# Patient Record
Sex: Female | Born: 1985 | Race: White | Hispanic: No | Marital: Single | Smoking: Never smoker
Health system: Southern US, Community
[De-identification: ages and names within clinical notes are randomized; demographics above are authoritative.]

## PROBLEM LIST (undated history)

## (undated) DIAGNOSIS — G43909 Migraine, unspecified, not intractable, without status migrainosus: Secondary | ICD-10-CM

## (undated) DIAGNOSIS — S0300XA Dislocation of jaw, unspecified side, initial encounter: Secondary | ICD-10-CM

## (undated) DIAGNOSIS — T7840XA Allergy, unspecified, initial encounter: Secondary | ICD-10-CM

## (undated) HISTORY — PX: TUMOR REMOVAL: SHX12

## (undated) HISTORY — PX: OTHER SURGICAL HISTORY: SHX169

## (undated) HISTORY — DX: Migraine, unspecified, not intractable, without status migrainosus: G43.909

---

## 2013-03-07 ENCOUNTER — Encounter: Payer: Self-pay | Admitting: Obstetrics & Gynecology

## 2013-03-07 ENCOUNTER — Ambulatory Visit (INDEPENDENT_AMBULATORY_CARE_PROVIDER_SITE_OTHER): Payer: BC Managed Care – PPO | Admitting: Obstetrics & Gynecology

## 2013-03-07 VITALS — BP 108/64 | HR 66 | Ht 71.0 in | Wt 192.0 lb

## 2013-03-07 DIAGNOSIS — Z01419 Encounter for gynecological examination (general) (routine) without abnormal findings: Secondary | ICD-10-CM | POA: Insufficient documentation

## 2013-03-07 DIAGNOSIS — Z124 Encounter for screening for malignant neoplasm of cervix: Secondary | ICD-10-CM

## 2013-03-07 MED ORDER — LEVONORGESTREL-ETHINYL ESTRAD 0.15-30 MG-MCG PO TABS: 1.0000 | ORAL_TABLET | Freq: Every day | ORAL | Status: DC

## 2013-03-07 NOTE — Progress Notes (Signed)
  Subjective:     Lynn Sosa is a 27 y.o. female here for a routine exam.  Current complaints: none.  Personal health questionnaire reviewed: yes.   Gynecologic History Patient's last menstrual period was 02/20/2013. Contraception: OCP (estrogen/progesterone) Last Pap: 2011. Results were: normal Last mammogram: n/a. Results were: normal  Obstetric History OB History   Grav Para Term Preterm Abortions TAB SAB Ect Mult Living                   The following portions of the patient's history were reviewed and updated as appropriate: allergies, current medications, past family history, past medical history, past social history, past surgical history and problem list.  Review of Systems A comprehensive review of systems was negative.    Objective:   Filed Vitals:   03/07/13 0902  BP: 108/64  Pulse: 66  Height: 5\' 11"  (1.803 m)  Weight: 192 lb (87.091 kg)      Vitals:  WNL General appearance: alert, cooperative and no distress Head: Normocephalic, without obvious abnormality, atraumatic Eyes: negative Throat: lips, mucosa, and tongue normal; teeth and gums normal Lungs: clear to auscultation bilaterally Breasts: normal appearance, no masses or tenderness, No nipple retraction or dimpling, No nipple discharge or bleeding Heart: regular rate and rhythm Abdomen: soft, non-tender; bowel sounds normal; no masses,  no organomegaly Pelvic: cervix normal in appearance, external genitalia normal, no adnexal masses or tenderness, no bladder tenderness, no cervical motion tenderness, perianal skin: no external genital warts noted, urethra without abnormality or discharge, uterus normal size, shape, and consistency and vagina normal without discharge Extremities: no edema, redness or tenderness in the calves or thighs Skin: no lesions or rash Lymph nodes: Axillary adenopathy: none        Assessment:    Healthy female exam.    Plan:    Education reviewed: self breast exams and  skin cancer screening. Contraception: OCP (estrogen/progesterone). Follow up in: 1 years.

## 2014-02-21 ENCOUNTER — Other Ambulatory Visit: Payer: Self-pay | Admitting: Obstetrics & Gynecology

## 2014-03-12 ENCOUNTER — Ambulatory Visit: Payer: BC Managed Care – PPO | Admitting: Obstetrics & Gynecology

## 2014-03-18 ENCOUNTER — Ambulatory Visit (INDEPENDENT_AMBULATORY_CARE_PROVIDER_SITE_OTHER): Payer: PRIVATE HEALTH INSURANCE | Admitting: Advanced Practice Midwife

## 2014-03-18 ENCOUNTER — Encounter: Payer: Self-pay | Admitting: Advanced Practice Midwife

## 2014-03-18 VITALS — BP 119/74 | HR 71 | Resp 16 | Ht 71.0 in | Wt 184.0 lb

## 2014-03-18 DIAGNOSIS — Z113 Encounter for screening for infections with a predominantly sexual mode of transmission: Secondary | ICD-10-CM

## 2014-03-18 DIAGNOSIS — Z1151 Encounter for screening for human papillomavirus (HPV): Secondary | ICD-10-CM

## 2014-03-18 DIAGNOSIS — Z124 Encounter for screening for malignant neoplasm of cervix: Secondary | ICD-10-CM

## 2014-03-18 DIAGNOSIS — N921 Excessive and frequent menstruation with irregular cycle: Secondary | ICD-10-CM

## 2014-03-18 DIAGNOSIS — Z01419 Encounter for gynecological examination (general) (routine) without abnormal findings: Secondary | ICD-10-CM

## 2014-03-18 MED ORDER — LEVONORGESTREL-ETHINYL ESTRAD 0.15-30 MG-MCG PO TABS: ORAL_TABLET | ORAL | Status: DC

## 2014-03-18 NOTE — Progress Notes (Signed)
Subjective:     Lynn Sosa is a 28 y.o. female here for a routine exam.  Current complaints: spotting in between periods (she also reports nosebleed recently).     Gynecologic History Patient's last menstrual period was 02/18/2014. Contraception: OCP (estrogen/progesterone) Last Pap: 2013. Results were: normal Last mammogram: n/a related to age  Obstetric History OB History  Gravida Para Term Preterm AB SAB TAB Ectopic Multiple Living  0 0 0 0 0 0 0 0 0 0          The following portions of the patient's history were reviewed and updated as appropriate: allergies, current medications, past family history, past medical history, past social history, past surgical history and problem list.  Review of Systems A comprehensive review of systems was negative.    Objective:    BP 119/74  Pulse 71  Resp 16  Ht 5\' 11"  (1.803 m)  Wt 83.462 kg (184 lb)  BMI 25.67 kg/m2  LMP 02/18/2014  General Appearance:    Alert, cooperative, no distress, appears stated age  Head:    Normocephalic, without obvious abnormality, atraumatic  Eyes:    PERRL, conjunctiva/corneas clear, EOM's intact, fundi    benign, both eyes  Ears:    Normal TM's and external ear canals, both ears  Nose:   Nares normal, septum midline, mucosa normal, no drainage    or sinus tenderness  Throat:   Lips, mucosa, and tongue normal; teeth and gums normal  Neck:   Supple, symmetrical, trachea midline, no adenopathy;    thyroid:  no enlargement/tenderness/nodules; no carotid   bruit or JVD  Back:     Symmetric, no curvature, ROM normal, no CVA tenderness  Lungs:     Clear to auscultation bilaterally, respirations unlabored  Chest Wall:    No tenderness or deformity   Heart:    Regular rate and rhythm, S1 and S2 normal, no murmur, rub   or gallop  Breast Exam:    No tenderness, masses, or nipple abnormality  Abdomen:     Soft, non-tender, bowel sounds active all four quadrants,    no masses, no organomegaly  Genitalia:     Cervix pink, visually closed, without lesion but significantly friable, scant white creamy discharge, vaginal walls and external genitalia normal Bimanual exam: Cervix 0/long/high, firm, anterior, neg CMT, uterus nontender, nonenlarged, adnexa without tenderness, enlargement, or mass  Rectal:    Normal tone, normal prostate, no masses or tenderness;   guaiac negative stool  Extremities:   Extremities normal, atraumatic, no cyanosis or edema  Pulses:   2+ and symmetric all extremities  Skin:   Skin color, texture, turgor normal, no rashes or lesions  Lymph nodes:   Cervical, supraclavicular, and axillary nodes normal  Neurologic:   CNII-XII intact, normal strength, sensation and reflexes    throughout      Assessment:    Healthy female exam.    Plan:    Education reviewed: safe sex/STD prevention, self breast exams and weight bearing exercise. Return in 1 year for annual well-woman exam.  Pap schedule based on results today.    Addendum:  Discussed pt friable cervix with Dr Marice Potterove.  Likely ectropion r/t OCPs.  Pt may stop OCPs x1-2 months if desired then resume.  Called pt to discuss and reassure her of likely normal finding.  Pap results pending. Pt states understanding.

## 2014-03-18 NOTE — Patient Instructions (Signed)
   No Primary Care Doctor:  To locate a primary care doctor that accepts your insurance or provides certain services:           [place] [facility]: [phone]           Physician Referral Service: [phone] ask for "My [place]"   If no insurance, you need to see if you qualify for [facility] "[facility]", call to set      up appointment for eligibility/enrollment at [phone] or [phone] or visit [facility] and [employer] ([address], [place] and [address]) to meet with a [facility] enrollment specialist.

## 2014-03-20 DIAGNOSIS — N921 Excessive and frequent menstruation with irregular cycle: Secondary | ICD-10-CM | POA: Insufficient documentation

## 2014-03-20 LAB — CERVICOVAGINAL ANCILLARY ONLY
BACTERIAL VAGINITIS: NEGATIVE
Candida vaginitis: NEGATIVE

## 2015-02-20 ENCOUNTER — Other Ambulatory Visit: Payer: Self-pay | Admitting: *Deleted

## 2015-02-20 DIAGNOSIS — Z01419 Encounter for gynecological examination (general) (routine) without abnormal findings: Secondary | ICD-10-CM

## 2015-02-20 MED ORDER — LEVONORGESTREL-ETHINYL ESTRAD 0.15-30 MG-MCG PO TABS: ORAL_TABLET | ORAL | Status: DC

## 2015-02-20 NOTE — Telephone Encounter (Signed)
RF request from [facility] [address] authorized x 1.  Will need annual appoint.

## 2015-03-19 ENCOUNTER — Other Ambulatory Visit: Payer: Self-pay | Admitting: Obstetrics & Gynecology

## 2015-03-21 ENCOUNTER — Ambulatory Visit (INDEPENDENT_AMBULATORY_CARE_PROVIDER_SITE_OTHER): Payer: PRIVATE HEALTH INSURANCE | Admitting: Family

## 2015-03-21 ENCOUNTER — Encounter: Payer: Self-pay | Admitting: Family

## 2015-03-21 VITALS — BP 125/72 | HR 84 | Resp 16 | Ht 71.0 in | Wt 180.0 lb

## 2015-03-21 DIAGNOSIS — Z3041 Encounter for surveillance of contraceptive pills: Secondary | ICD-10-CM

## 2015-03-21 DIAGNOSIS — Z01419 Encounter for gynecological examination (general) (routine) without abnormal findings: Secondary | ICD-10-CM | POA: Diagnosis not present

## 2015-03-21 DIAGNOSIS — R102 Pelvic and perineal pain: Secondary | ICD-10-CM | POA: Diagnosis not present

## 2015-03-21 MED ORDER — LEVONORGESTREL-ETHINYL ESTRAD 0.15-30 MG-MCG PO TABS: ORAL_TABLET | ORAL | Status: DC

## 2015-03-21 NOTE — Progress Notes (Signed)
  Subjective:     Lynn Sosa is a 29 y.o. female here for a routine exam.  Current complaints: felt left sided pain x 2 a month ago.  History of ovarian cyst. Partner x 3 years.  Declines STD screen.     Gynecologic History Patient's last menstrual period was 03/17/2015. Contraception: OCP (estrogen/progesterone) Last Pap: March 2015. Results were: normal; HR HPV negative Last mammogram: n/a.   Obstetric History OB History  Gravida Para Term Preterm AB SAB TAB Ectopic Multiple Living  0 0 0 0 0 0 0 0 0 0          The following portions of the patient's history were reviewed and updated as appropriate: allergies, current medications, past family history, past medical history, past social history, past surgical history and problem list.  Review of Systems Pertinent items are noted in HPI.    Objective:   BP 125/72 mmHg  Pulse 84  Resp 16  Ht 5\' 11"  (1.803 m)  Wt 180 lb (81.647 kg)  BMI 25.12 kg/m2  LMP 03/17/2015 General appearance: alert, cooperative and appears stated age Head: Normocephalic, without obvious abnormality, atraumatic Neck: no adenopathy, no carotid bruit, no JVD, supple, symmetrical, trachea midline and thyroid not enlarged, symmetric, no tenderness/mass/nodules Lungs: clear to auscultation bilaterally Breasts: normal appearance, no masses or tenderness, No nipple retraction or dimpling, No nipple discharge or bleeding, No axillary or supraclavicular adenopathy, Normal to palpation without dominant masses, Taught monthly breast self examination Heart: regular rate and rhythm, S1, S2 normal, no murmur, click, rub or gallop Abdomen: soft, non-tender; bowel sounds normal; no masses,  no organomegaly Pelvic: cervix normal in appearance, external genitalia normal, no adnexal masses or tenderness, no cervical motion tenderness, rectovaginal septum normal, uterus normal size, shape, and consistency and vagina normal without discharge Skin: Skin color, texture, turgor  normal. No rashes or lesions     Assessment:   Intermittent Left-sided pelvic pain  Healthy female exam.    Plan:    Contraception: oral progesterone-only contraceptive.    Report if pain returns will do pelvic ultrasound to assess for cysts.   Return in one year for well woman exam - will do pap at that visit.  Not needed today - negative HR HPV  Marlis EdelsonWalidah N Karim, CNM

## 2016-03-17 ENCOUNTER — Other Ambulatory Visit: Payer: Self-pay | Admitting: Family

## 2017-02-11 ENCOUNTER — Other Ambulatory Visit: Payer: Self-pay | Admitting: Family

## 2017-05-30 ENCOUNTER — Encounter: Payer: Self-pay | Admitting: Obstetrics & Gynecology

## 2017-05-30 ENCOUNTER — Ambulatory Visit (INDEPENDENT_AMBULATORY_CARE_PROVIDER_SITE_OTHER): Payer: PRIVATE HEALTH INSURANCE | Admitting: Obstetrics & Gynecology

## 2017-05-30 VITALS — BP 108/71 | HR 68 | Ht 71.0 in | Wt 218.0 lb

## 2017-05-30 DIAGNOSIS — Z01419 Encounter for gynecological examination (general) (routine) without abnormal findings: Secondary | ICD-10-CM | POA: Diagnosis not present

## 2017-05-30 DIAGNOSIS — Z113 Encounter for screening for infections with a predominantly sexual mode of transmission: Secondary | ICD-10-CM | POA: Diagnosis not present

## 2017-05-30 MED ORDER — NORETHIN ACE-ETH ESTRAD-FE 1-20 MG-MCG(24) PO TABS: 1.0000 | ORAL_TABLET | Freq: Every day | ORAL | 11 refills | Status: DC

## 2017-05-30 NOTE — Progress Notes (Signed)
Subjective:     Lynn Sosa is a 31 y.o. female here for a routine exam.  Current complaints: darkening of skin over upper lip and occasional spotting week 2 or 3 of birth control pills.  She has been on same brand for 10 years.  Recently broke up with boyfriend and he is causing stress (mild stalking).  Pt has had a few new sexual partners and did not use condoms every time.   Gynecologic History Patient's last menstrual period was 05/10/2017. Contraception: OCP (estrogen/progesterone) Last Pap: due today.   Obstetric History OB History  Gravida Para Term Preterm AB Living  0 0 0 0 0 0  SAB TAB Ectopic Multiple Live Births  0 0 0 0           The following portions of the patient's history were reviewed and updated as appropriate: allergies, current medications, past family history, past medical history, past social history, past surgical history and problem list.  Review of Systems Pertinent items noted in HPI and remainder of comprehensive ROS otherwise negative.    Objective:      Vitals:   05/30/17 1506  BP: 108/71  Pulse: 68  Weight: 218 lb (98.9 kg)  Height: 5\' 11"  (1.803 m)   Vitals:  WNL General appearance: alert, cooperative and no distress  HEENT: Normocephalic, without obvious abnormality, atraumatic Eyes: negative Throat: lips, mucosa, and tongue normal; teeth and gums normal  Respiratory: Clear to auscultation bilaterally  CV: Regular rate and rhythm  Breasts:  Normal appearance, no masses or tenderness, no nipple retraction or dimpling  GI: Soft, non-tender; bowel sounds normal; no masses,  no organomegaly  GU: External Genitalia:  Tanner V, no lesion Urethra:  No prolapse   Vagina: Pink, normal rugae, no blood or discharge  Cervix: No CMT, no lesion  Uterus:  Normal size and contour, non tender  Adnexa: Normal, no masses, non tender  Musculoskeletal: No edema, redness or tenderness in the calves or thighs  Skin: Slight darkening of skin above upper  lip, no hair growth  Lymphatic: Axillary adenopathy: none     Psychiatric: Normal mood and behavior     Assessment:    Healthy female exam.      Plan:   STD check (cultures and blood work) Pap with cotesting Grandmother and 3 great aunts with breast cancer; will discuss with her mother about genetic testing. Change OCPs to lo estrin 24 Derm referral for skin changes.

## 2017-05-31 LAB — SYPHILIS: RPR W/REFLEX TO RPR TITER AND TREPONEMAL ANTIBODIES, TRADITIONAL SCREENING AND DIAGNOSIS ALGORITHM

## 2017-05-31 LAB — HIV ANTIBODY (ROUTINE TESTING W REFLEX): HIV 1&2 Ab, 4th Generation: NONREACTIVE

## 2017-05-31 LAB — HEPATITIS B SURFACE ANTIGEN: Hepatitis B Surface Ag: NEGATIVE

## 2017-05-31 LAB — HEPATITIS C ANTIBODY: HCV AB: NEGATIVE

## 2017-06-02 ENCOUNTER — Telehealth: Payer: Self-pay | Admitting: *Deleted

## 2017-06-02 NOTE — Telephone Encounter (Signed)
Pt left message on nurse voice mail requesting test results.

## 2017-06-02 NOTE — Telephone Encounter (Signed)
Pt left message on voicemail requesting her test results from 05/30/17.  I called patient and left message on her voicemail that all of her labwork was normal but that the pap and associated labs were still pending.

## 2017-06-03 LAB — CYTOLOGY - PAP
CHLAMYDIA, DNA PROBE: NEGATIVE
Diagnosis: NEGATIVE
HPV (WINDOPATH): DETECTED — AB
NEISSERIA GONORRHEA: NEGATIVE

## 2017-06-07 ENCOUNTER — Encounter: Payer: Self-pay | Admitting: Obstetrics & Gynecology

## 2017-06-07 ENCOUNTER — Encounter: Payer: Self-pay | Admitting: *Deleted

## 2017-06-07 ENCOUNTER — Telehealth: Payer: Self-pay | Admitting: *Deleted

## 2017-06-07 DIAGNOSIS — R87810 Cervical high risk human papillomavirus (HPV) DNA test positive: Secondary | ICD-10-CM | POA: Insufficient documentation

## 2017-06-07 NOTE — Telephone Encounter (Signed)
Letter mailed to patient stating that her pap was normal but positive for HPV and recommend repeat pap in 1 year per Dr Penne LashLeggett.

## 2017-06-07 NOTE — Telephone Encounter (Signed)
-----   Message from Lesly DukesKelly H Leggett, MD sent at 06/07/2017  5:13 AM EDT ----- HPV positive with Nml cytology.  Rpt co-testing in 1 year.  Notify patient.

## 2017-06-08 ENCOUNTER — Telehealth: Payer: Self-pay | Admitting: Obstetrics & Gynecology

## 2017-06-08 NOTE — Telephone Encounter (Signed)
Pt states that when she came to her appt, she was told that there would be some changes to her birth control pills and she wants to know what changes were made and what side effects she can expect. Pt also wants to know if there is a cheaper option. Please advise.

## 2017-06-08 NOTE — Telephone Encounter (Signed)
Spoke with pt about the changes in her BC pills. She was also notified about her Neg pap but positive HPV.  She will repeat the pap in 1 year.

## 2017-09-11 ENCOUNTER — Encounter (HOSPITAL_COMMUNITY): Payer: Self-pay | Admitting: *Deleted

## 2017-09-11 ENCOUNTER — Ambulatory Visit (HOSPITAL_COMMUNITY)
Admission: EM | Admit: 2017-09-11 | Discharge: 2017-09-11 | Disposition: A | Discharge status: Home / Self Care | Payer: PRIVATE HEALTH INSURANCE | Attending: Family Medicine | Admitting: Family Medicine

## 2017-09-11 DIAGNOSIS — S0990XA Unspecified injury of head, initial encounter: Secondary | ICD-10-CM

## 2017-09-11 HISTORY — DX: Dislocation of jaw, unspecified side, initial encounter: S03.00XA

## 2017-09-11 NOTE — ED Provider Notes (Signed)
[facility]   [id number] 09/11/17 Arrival Time: 1650   SUBJECTIVE:  Lynn Sosa is a 31 y.o. female who presents to the urgent care with complaint of head injury 6 hours ago when moving a desk and getting up striking the left parietal area of scalp.  No vomiting or visual disturbance.     Past Medical History:  Diagnosis Date  . Migraines   . TMJ (dislocation of temporomandibular joint)    Family History  Problem Relation Age of Onset  . Arthritis Mother   . Hypertension Mother   . Liver disease Father   . Cancer Maternal Aunt        breast CA  . Cancer Maternal Grandmother        breast CA  . Hypertension Maternal Grandmother   . Diabetes Paternal Grandmother   . Cancer Paternal Grandfather        liver   Social History   Social History  . Marital status: Single    Spouse name: N/A  . Number of children: N/A  . Years of education: N/A   Occupational History  . Not on file.   Social History Main Topics  . Smoking status: Never Smoker  . Smokeless tobacco: Never Used  . Alcohol use Yes     Comment: Socially  . Drug use: No  . Sexual activity: Not on file   Other Topics Concern  . Not on file   Social History Narrative  . No narrative on file   Current Meds  Medication Sig  . fluticasone (FLONASE) 50 MCG/ACT nasal spray 1 spray by Each Nare route daily.  Marland Kitchen loratadine (CLARITIN) 10 MG tablet Take 10 mg by mouth.   No Known Allergies    ROS: As per HPI, remainder of ROS negative.   OBJECTIVE:   Vitals:   09/11/17 1722  BP: (!) 115/59  Pulse: 69  Resp: 16  Temp: 98.2 F (36.8 C)  TempSrc: Oral  SpO2: 99%     General appearance: alert; no distress Eyes: PERRL; EOMI; conjunctiva normal HENT: normocephalic; atraumatic; TMs normal, canal normal, external ears normal without trauma; nasal mucosa normal; oral mucosa normal; fundi normal Neck: supple Back: no CVA tenderness Extremities: no cyanosis or edema; symmetrical with no  gross deformities Skin: warm and dry. 2 cm left parietal lac, dry and superficial Neurologic: normal gait; grossly normal Psychological: alert and cooperative; normal mood and affect    Labs:  Results for orders placed or performed in visit on 05/30/17  RPR  Result Value Ref Range   RPR Ser Ql NON REAC NON REAC  HIV antibody (with reflex)  Result Value Ref Range   HIV 1&2 Ab, 4th Generation NONREACTIVE NONREACTIVE  Hepatitis B Surface AntiGEN  Result Value Ref Range   Hepatitis B Surface Ag NEGATIVE NEGATIVE  Hepatitis C Antibody  Result Value Ref Range   HCV Ab NEGATIVE NEGATIVE  Cytology - PAP  Result Value Ref Range   Adequacy      Satisfactory for evaluation  endocervical/transformation zone component PRESENT.   Diagnosis      NEGATIVE FOR INTRAEPITHELIAL LESIONS OR MALIGNANCY. BENIGN REACTIVE/REPARATIVE CHANGES.   Chlamydia Negative    Neisseria gonorrhea Negative    HPV DETECTED (A)    Material Submitted CervicoVaginal Pap [ThinPrep Imaged]    CYTOLOGY - PAP PAP RESULT     Labs Reviewed - No data to display  No results found.     ASSESSMENT & PLAN:  1. Minor head  injury, initial encounter     No orders of the defined types were placed in this encounter.   Reviewed expectations re: course of current medical issues. Questions answered. Outlined signs and symptoms indicating need for more acute intervention. Patient verbalized understanding. After Visit Summary given.       Elvina Sidle, MD 09/11/17 1746

## 2017-09-11 NOTE — ED Triage Notes (Signed)
Reports moving a rug underneath a desk at approx 1130 today when she came up, hitting head on desk.  Denies any LOC.  C/O laceration to scalp - small superficial laceration noted.  C/O nausea and significant HA despite IBU.

## 2018-11-06 ENCOUNTER — Ambulatory Visit (INDEPENDENT_AMBULATORY_CARE_PROVIDER_SITE_OTHER): Payer: BLUE CROSS/BLUE SHIELD

## 2018-11-06 ENCOUNTER — Ambulatory Visit (HOSPITAL_COMMUNITY)
Admission: EM | Admit: 2018-11-06 | Discharge: 2018-11-06 | Disposition: A | Discharge status: Home / Self Care | Payer: BLUE CROSS/BLUE SHIELD | Attending: Emergency Medicine | Admitting: Emergency Medicine

## 2018-11-06 ENCOUNTER — Encounter (HOSPITAL_COMMUNITY): Payer: Self-pay | Admitting: Emergency Medicine

## 2018-11-06 DIAGNOSIS — M79671 Pain in right foot: Secondary | ICD-10-CM

## 2018-11-06 MED ORDER — MELOXICAM 15 MG PO TABS: 15.0000 mg | ORAL_TABLET | Freq: Every day | ORAL | 0 refills | Status: AC

## 2018-11-06 NOTE — ED Provider Notes (Signed)
[facility]    CSN: [id number] Arrival date & time: 11/06/18  1804     History   Chief Complaint Chief Complaint  Patient presents with  . Foot Pain    HPI Lynn Sosa is a 32 y.o. female.   Lynn Sosa presents with complaints of right lateral foot pain which started two days ago. She is training for a half marathon in 5 days, was doing a longer run at onset of pain. At mile 8 developed pain and had to walk. Pain has persisted since, states she has been on her feet a lot for work since as well. Yesterday wore heels which did not seem to exacerbate the pain. Today had pain which waxed and waned, worsens with weight bearing. 4/10 currently but when it flares it increases to 7/10. Denies any previous similar. States has had history of pain to arches but this feels different. No numbness or tingling. No other specific injury to the foot. Has taken ibuprofen and tylenol which haven't helped. Mild swelling this morning, no redness. Hx of tmj and migraines.     ROS per HPI.      Past Medical History:  Diagnosis Date  . Migraines   . TMJ (dislocation of temporomandibular joint)     Patient Active Problem List   Diagnosis Date Noted  . Cervical high risk HPV (human papillomavirus) test positive 06/07/2017  . Breakthrough bleeding on OCPs 03/20/2014  . Routine gynecological examination 03/07/2013    Past Surgical History:  Procedure Laterality Date  . 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 wisdom teeth removal      OB History    Gravida  0   Para  0   Term  0   Preterm  0   AB  0   Living  0     SAB  0   TAB  0   Ectopic  0   Multiple  0   Live Births               Home Medications    Prior to Admission medications   Medication Sig Start Date End Date Taking? Authorizing Provider  fluticasone (FLONASE) 50 MCG/ACT nasal spray 1 spray by Each Nare route daily.    [provider]  meloxicam (MOBIC) 15 MG  tablet Take 1 tablet (15 mg total) by mouth daily. 11/06/18   Georgetta HaberBurky, Natalie B, NP    Family History Family History  Problem Relation Age of Onset  . Arthritis Mother   . Hypertension Mother   . Liver disease Father   . Cancer Maternal Aunt        breast CA  . Cancer Maternal Grandmother        breast CA  . Hypertension Maternal Grandmother   . Diabetes Paternal Grandmother   . Cancer Paternal Grandfather        liver    Social History Social History   Tobacco Use  . Smoking status: Never Smoker  . Smokeless tobacco: Never Used  Substance Use Topics  . Alcohol use: Yes    Comment: Socially  . Drug use: No     Allergies   Patient has no known allergies.   Review of Systems Review of Systems   Physical Exam Triage Vital Signs ED Triage Vitals [11/06/18 1908]  Enc Vitals Group     BP 119/76     Pulse Rate 71     Resp 16  Temp 97.9 F (36.6 C)     Temp src      SpO2 100 %     Weight      Height      Head Circumference      Peak Flow      Pain Score 3     Pain Loc      Pain Edu?      Excl. in GC?    No data found.  Updated Vital Signs BP 119/76   Pulse 71   Temp 97.9 F (36.6 C)   Resp 16   LMP 11/06/2018   SpO2 100%   Visual Acuity Right Eye Distance:   Left Eye Distance:   Bilateral Distance:    Right Eye Near:   Left Eye Near:    Bilateral Near:     Physical Exam  Constitutional: She is oriented to person, place, and time. She appears well-developed and well-nourished. No distress.  Cardiovascular: Normal rate, regular rhythm and normal heart sounds.  Pulmonary/Chest: Effort normal and breath sounds normal.  Musculoskeletal:       Right ankle: Normal.       Right foot: There is tenderness and bony tenderness. There is normal range of motion, no swelling, normal capillary refill, no crepitus, no deformity and no laceration.       Feet:  Right proximal and lateral 5th metatarsal with tenderness; no bruising or swelling; no pain  to MTP joints or to toes; full ROM of toes and ankle; cap refill < 2 seconds; strong pedal pulse   Neurological: She is alert and oriented to person, place, and time.  Skin: Skin is warm and dry.     UC Treatments / Results  Labs (all labs ordered are listed, but only abnormal results are displayed) Labs Reviewed - No data to display  EKG None  Radiology Dg Foot Complete Right  Result Date: 11/06/2018 CLINICAL DATA:  32 year old female with pain while long distance running two days ago. EXAM: RIGHT FOOT COMPLETE - 3+ VIEW COMPARISON:  None. FINDINGS: Bone mineralization is within normal limits. There is no evidence of fracture or dislocation. There is no evidence of arthropathy or other focal bone abnormality. Soft tissues are unremarkable. IMPRESSION: No osseous abnormality identified. Follow-up radiographs are recommended if symptoms persist. Electronically Signed   By: Odessa Fleming M.D.   On: 11/06/2018 20:04    Procedures Procedures (including critical care time)  Medications Ordered in UC Medications - No data to display  Initial Impression / Assessment and Plan / UC Course  I have reviewed the triage vital signs and the nursing notes.  Pertinent labs & imaging results that were available during my care of the patient were reviewed by me and considered in my medical decision making (see chart for details).     Post op shoe provided. Strain vs tendonitis vs stress fracture related to increased mileage from running considered. Ice, elevation, meloxicam for pain. Encouraged resting from running until symptoms improvement. Follow up with sports medicine and/or podiatry as needed. Patient verbalized understanding and agreeable to plan. Ambulatory out of clinic without difficulty.    Final Clinical Impressions(s) / UC Diagnoses   Final diagnoses:  Foot pain, right     Discharge Instructions     Xray is normal today for bony abnormalities.  Strain vs stress fracture vs  tendonitis related to increased long distance running.  Will treat with a shoe which provides support to the foot, ice, elevation.  I would  recommend decrease in long distance running until symptoms improve, as this is likely triggering your symptoms.  Please follow up with orthopedics and/or podiatry for persistent or worsening symptoms.     ED Prescriptions    Medication Sig Dispense Auth. Provider   meloxicam (MOBIC) 15 MG tablet Take 1 tablet (15 mg total) by mouth daily. 20 tablet Georgetta Haber, NP     Controlled Substance Prescriptions [place] Controlled Substance Registry consulted? Not Applicable   Georgetta Haber, NP 11/06/18 2035

## 2018-11-06 NOTE — ED Triage Notes (Signed)
Pt states shes been training for a half marathon and has been running a lot, states she ran 10 miles Saturday and her R foot started aching. No injury.

## 2018-11-06 NOTE — Discharge Instructions (Addendum)
Xray is normal today for bony abnormalities.  Strain vs stress fracture vs tendonitis related to increased long distance running.  Will treat with a shoe which provides support to the foot, ice, elevation.  I would recommend decrease in long distance running until symptoms improve, as this is likely triggering your symptoms.  Please follow up with orthopedics and/or podiatry for persistent or worsening symptoms.

## 2020-02-21 ENCOUNTER — Other Ambulatory Visit: Payer: Self-pay

## 2020-02-21 ENCOUNTER — Ambulatory Visit: Payer: BC Managed Care – PPO | Attending: Internal Medicine

## 2020-02-22 ENCOUNTER — Ambulatory Visit: Payer: BC Managed Care – PPO | Attending: Internal Medicine

## 2020-02-22 DIAGNOSIS — Z20822 Contact with and (suspected) exposure to covid-19: Secondary | ICD-10-CM

## 2020-02-23 ENCOUNTER — Ambulatory Visit: Payer: BC Managed Care – PPO | Attending: Internal Medicine

## 2020-02-23 DIAGNOSIS — Z23 Encounter for immunization: Secondary | ICD-10-CM | POA: Insufficient documentation

## 2020-02-23 LAB — NOVEL CORONAVIRUS, NAA: SARS-CoV-2, NAA: NOT DETECTED

## 2020-02-23 NOTE — Progress Notes (Signed)
   Covid-19 Vaccination Clinic  Name:  Lynn Sosa    MRN: [id number] DOB: 10/02/1986  02/23/2020  [name] was observed post Covid-19 immunization for 15 minutes without incident. She was provided with Vaccine Information Sheet and instruction to access the V-Safe system.   [name] was instructed to call 911 with any severe reactions post vaccine: Marland Kitchen Difficulty breathing  . Swelling of face and throat  . A fast heartbeat  . A bad rash all over body  . Dizziness and weakness   Immunizations Administered    Name Date Dose VIS Date Route   Pfizer COVID-19 Vaccine 02/23/2020 10:41 AM 0.3 mL 11/30/2019 Intramuscular   Manufacturer: [employer], [employer]   Lot: [id number]   NDC: [address]-6

## 2020-02-25 ENCOUNTER — Ambulatory Visit: Payer: BC Managed Care – PPO | Attending: Internal Medicine

## 2020-02-25 DIAGNOSIS — Z20822 Contact with and (suspected) exposure to covid-19: Secondary | ICD-10-CM

## 2020-02-26 LAB — NOVEL CORONAVIRUS, NAA: SARS-CoV-2, NAA: NOT DETECTED

## 2020-03-15 ENCOUNTER — Ambulatory Visit: Payer: BC Managed Care – PPO | Attending: Internal Medicine

## 2020-03-15 DIAGNOSIS — Z23 Encounter for immunization: Secondary | ICD-10-CM

## 2020-03-15 NOTE — Progress Notes (Signed)
   Covid-19 Vaccination Clinic  Name:  Lynn Sosa    MRN: [id number] DOB: 1986-01-05  03/15/2020  Lynn Sosa was observed post Covid-19 immunization for 15 minutes without incident. She was provided with Vaccine Information Sheet and instruction to access the V-Safe system.   Lynn Sosa was instructed to call 911 with any severe reactions post vaccine: Marland Kitchen Difficulty breathing  . Swelling of face and throat  . A fast heartbeat  . A bad rash all over body  . Dizziness and weakness   Immunizations Administered    Name Date Dose VIS Date Route   Pfizer COVID-19 Vaccine 03/15/2020 11:41 AM 0.3 mL 11/30/2019 Intramuscular   Manufacturer: [employer], [employer]   Lot: [id number]   NDC: [address]-3

## 2020-03-18 ENCOUNTER — Ambulatory Visit: Payer: Self-pay

## 2020-03-18 NOTE — Telephone Encounter (Signed)
Pt. Had second COVID 19 vaccine this past Saturday. Sunday noted under her left arm an enlarged lymph node. Swollen and tender. Will try Motrin and warm compresses. If no better in 24 hours, will contact her PCP. No fever, does feel tired.  Answer Assessment - Initial Assessment Questions 1. SYMPTOMS: "What is the main symptom?" (e.g., redness, swelling, pain)      Enlarged lymphnode left arm pit 2. ONSET: "When was the vaccine (shot) given?" "How much later did the the next begin?" (e.g., hours, days ago)      Sunday after vaccine 3. SEVERITY: "How bad is it?"      Moderate 4. FEVER: "Is there a fever?" If so, ask: "What is it, how was it measured, and when did it start?"      No 5. IMMUNIZATIONS GIVEN: "What shots have you recently received?"     COVID 19 6. PAST REACTIONS: "Have you reacted to immunizations before?" If so, ask: "What happened?"     No 7. OTHER SYMPTOMS: "Do you have any other symptoms?"     Tired  Protocols used: IMMUNIZATION REACTIONS-A-AH

## 2020-08-15 ENCOUNTER — Encounter (HOSPITAL_COMMUNITY): Payer: Self-pay | Admitting: Family Medicine

## 2020-08-15 ENCOUNTER — Ambulatory Visit (HOSPITAL_COMMUNITY)
Admission: EM | Admit: 2020-08-15 | Discharge: 2020-08-15 | Disposition: A | Discharge status: Home / Self Care | Payer: BC Managed Care – PPO | Attending: Family Medicine | Admitting: Family Medicine

## 2020-08-15 ENCOUNTER — Other Ambulatory Visit: Payer: Self-pay

## 2020-08-15 DIAGNOSIS — Z79899 Other long term (current) drug therapy: Secondary | ICD-10-CM | POA: Diagnosis not present

## 2020-08-15 DIAGNOSIS — J029 Acute pharyngitis, unspecified: Secondary | ICD-10-CM | POA: Diagnosis present

## 2020-08-15 DIAGNOSIS — Z20822 Contact with and (suspected) exposure to covid-19: Secondary | ICD-10-CM | POA: Diagnosis not present

## 2020-08-15 DIAGNOSIS — G43909 Migraine, unspecified, not intractable, without status migrainosus: Secondary | ICD-10-CM | POA: Diagnosis not present

## 2020-08-15 DIAGNOSIS — Z791 Long term (current) use of non-steroidal anti-inflammatories (NSAID): Secondary | ICD-10-CM | POA: Diagnosis not present

## 2020-08-15 DIAGNOSIS — Z8249 Family history of ischemic heart disease and other diseases of the circulatory system: Secondary | ICD-10-CM | POA: Diagnosis not present

## 2020-08-15 LAB — POCT RAPID STREP A, ED / UC: Streptococcus, Group A Screen (Direct): NEGATIVE

## 2020-08-15 MED ORDER — AMOXICILLIN 500 MG PO CAPS: 1000.0000 mg | ORAL_CAPSULE | Freq: Every day | ORAL | 0 refills | Status: AC

## 2020-08-15 NOTE — ED Triage Notes (Signed)
Pt presents with sore throat xs 3 days. States she noticed white spots on tonsils yesterday. Denies fever.

## 2020-08-15 NOTE — Discharge Instructions (Addendum)
Strep test was negative but this is not definitive Based on the way your throat looks we will go ahead and treat We will send for culture Warm salt water gargles and ibuprofen as needed Follow up as needed for continued or worsening symptoms

## 2020-08-16 LAB — SARS CORONAVIRUS 2 (TAT 6-24 HRS): SARS Coronavirus 2: NEGATIVE

## 2020-08-17 LAB — CULTURE, GROUP A STREP (THRC)

## 2020-08-18 NOTE — ED Provider Notes (Signed)
Renal[facility] Fiddler    CSN: [id number] Arrival date & time: 08/15/20  0844      History   Chief Complaint Chief Complaint  Patient presents with  . Sore Throat    HPI Lynn Sosa is a 34 y.o. female.   Pt is a 34 year old female who presents today with sore throat.  This is been present x3 days.  Noticed white patches on the back of her throat.  Denies any fever, chills, body aches, cough, chest congestion, nasal congestion or rhinorrhea.     Past Medical History:  Diagnosis Date  . Migraines   . TMJ (dislocation of temporomandibular joint)     Patient Active Problem List   Diagnosis Date Noted  . Cervical high risk HPV (human papillomavirus) test positive 06/07/2017  . Breakthrough bleeding on OCPs 03/20/2014  . Routine gynecological examination 03/07/2013    Past Surgical History:  Procedure Laterality Date  . 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 wisdom teeth removal      OB History    Gravida  0   Para  0   Term  0   Preterm  0   AB  0   Living  0     SAB  0   TAB  0   Ectopic  0   Multiple  0   Live Births               Home Medications    Prior to Admission medications   Medication Sig Start Date End Date Taking? Authorizing Provider  amoxicillin (AMOXIL) 500 MG capsule Take 2 capsules (1,000 mg total) by mouth daily for 10 days. 08/15/20 08/25/20  Dahlia Byes A, NP  fluticasone (FLONASE) 50 MCG/ACT nasal spray 1 spray by Each Nare route daily.    [provider]  meloxicam (MOBIC) 15 MG tablet Take 1 tablet (15 mg total) by mouth daily. 11/06/18   Georgetta Haber, NP    Family History Family History  Problem Relation Age of Onset  . Arthritis Mother   . Hypertension Mother   . Liver disease Father   . Cancer Maternal Aunt        breast CA  . Cancer Maternal Grandmother        breast CA  . Hypertension Maternal Grandmother   . Diabetes Paternal Grandmother   . Cancer Paternal  Grandfather        liver    Social History Social History   Tobacco Use  . Smoking status: Never Smoker  . Smokeless tobacco: Never Used  Substance Use Topics  . Alcohol use: Yes    Comment: Socially  . Drug use: No     Allergies   Patient has no known allergies.   Review of Systems Review of Systems   Physical Exam Triage Vital Signs ED Triage Vitals  Enc Vitals Group     BP 08/15/20 1113 120/85     Pulse Rate 08/15/20 1113 64     Resp 08/15/20 1113 18     Temp 08/15/20 1113 98.3 F (36.8 C)     Temp Source 08/15/20 1113 Oral     SpO2 08/15/20 1113 98 %     Weight --      Height --      Head Circumference --      Peak Flow --      Pain Score 08/15/20 1111 0     Pain Loc --  Pain Edu? --      Excl. in GC? --    No data found.  Updated Vital Signs BP 120/85 (BP Location: Right Arm)   Pulse 64   Temp 98.3 F (36.8 C) (Oral)   Resp 18   LMP 07/06/2020   SpO2 98%   Visual Acuity Right Eye Distance:   Left Eye Distance:   Bilateral Distance:    Right Eye Near:   Left Eye Near:    Bilateral Near:     Physical Exam Vitals and nursing note reviewed.  Constitutional:      General: She is not in acute distress.    Appearance: Normal appearance. She is not ill-appearing, toxic-appearing or diaphoretic.  HENT:     Head: Normocephalic.     Nose: Nose normal.     Mouth/Throat:     Pharynx: Oropharynx is clear. Uvula midline. Posterior oropharyngeal erythema present.     Tonsils: Tonsillar exudate present. 2+ on the right. 2+ on the left.  Eyes:     Conjunctiva/sclera: Conjunctivae normal.  Pulmonary:     Effort: Pulmonary effort is normal.  Musculoskeletal:        General: Normal range of motion.     Cervical back: Normal range of motion.  Skin:    General: Skin is warm and dry.     Findings: No rash.  Neurological:     Mental Status: She is alert.  Psychiatric:        Mood and Affect: Mood normal.      UC Treatments / Results   Labs (all labs ordered are listed, but only abnormal results are displayed) Labs Reviewed  SARS CORONAVIRUS 2 (TAT 6-24 HRS)  CULTURE, GROUP A STREP [facility])  POCT RAPID STREP A, ED / UC    EKG   Radiology No results found.  Procedures Procedures (including critical care time)  Medications Ordered in UC Medications - No data to display  Initial Impression / Assessment and Plan / UC Course  I have reviewed the triage vital signs and the nursing notes.  Pertinent labs & imaging results that were available during my care of the patient were reviewed by me and considered in my medical decision making (see chart for details).     Sore throat Based on exam treating for strep.  Patient has history of strep in the past. Sending for culture.  Treat with amoxicillin. Warm salt water gargles and ibuprofen as needed Follow up as needed for continued or worsening symptoms  Final Clinical Impressions(s) / UC Diagnoses   Final diagnoses:  Sore throat     Discharge Instructions     Strep test was negative but this is not definitive Based on the way your throat looks we will go ahead and treat We will send for culture Warm salt water gargles and ibuprofen as needed Follow up as needed for continued or worsening symptoms     ED Prescriptions    Medication Sig Dispense Auth. Provider   amoxicillin (AMOXIL) 500 MG capsule Take 2 capsules (1,000 mg total) by mouth daily for 10 days. 20 capsule Dahlia Byes A, NP     PDMP not reviewed this encounter.   Janace Aris, NP 08/18/20 [phone]

## 2020-09-09 ENCOUNTER — Ambulatory Visit (HOSPITAL_COMMUNITY)
Admission: EM | Admit: 2020-09-09 | Discharge: 2020-09-09 | Disposition: A | Discharge status: Home / Self Care | Payer: BC Managed Care – PPO | Attending: Family Medicine | Admitting: Family Medicine

## 2020-09-09 ENCOUNTER — Other Ambulatory Visit: Payer: Self-pay

## 2020-09-09 ENCOUNTER — Encounter (HOSPITAL_COMMUNITY): Payer: Self-pay

## 2020-09-09 DIAGNOSIS — Z79899 Other long term (current) drug therapy: Secondary | ICD-10-CM | POA: Diagnosis not present

## 2020-09-09 DIAGNOSIS — R1030 Lower abdominal pain, unspecified: Secondary | ICD-10-CM | POA: Diagnosis not present

## 2020-09-09 DIAGNOSIS — Z3202 Encounter for pregnancy test, result negative: Secondary | ICD-10-CM

## 2020-09-09 LAB — POCT URINALYSIS DIPSTICK, ED / UC
Bilirubin Urine: NEGATIVE
Glucose, UA: NEGATIVE mg/dL
Ketones, ur: 15 mg/dL — AB
Leukocytes,Ua: NEGATIVE
Nitrite: NEGATIVE
Protein, ur: NEGATIVE mg/dL
Specific Gravity, Urine: 1.01 (ref 1.005–1.030)
Urobilinogen, UA: 0.2 mg/dL (ref 0.0–1.0)
pH: 5.5 (ref 5.0–8.0)

## 2020-09-09 LAB — POC URINE PREG, ED: Preg Test, Ur: NEGATIVE

## 2020-09-09 MED ORDER — ACETAMINOPHEN 325 MG PO TABS: 650.0000 mg | ORAL_TABLET | Freq: Four times a day (QID) | ORAL | 0 refills | Status: AC | PRN

## 2020-09-09 NOTE — ED Provider Notes (Signed)
[facility]    CSN: [id number] Arrival date & time: 09/09/20  1738      History   Chief Complaint Chief Complaint  Patient presents with  . Abdominal Pain    HPI Lynn Sosa is a 34 y.o. female.   Patient reports for 3 days of mid to lower abdominal pain.  She reports 4 out of 10 pain at baseline with intermittent sharp pains.  She reports sharp pains are short lasting.  She does state pain has been tolerable.  She reports pains are just below the bellybutton.  Presenting she reports she has been taking ibuprofen with some relief.  She reports she has been able to walk and play sports without much issue over the last few days.  She reports she comes in today as seemed a bit of sharp pain was giving her more frequent.  She denies nausea and vomiting.  She has been moving her bowels without diarrhea or constipation.  No blood or dark color.  She has not had any fevers.  She denies painful urination, frequency or urgency.  Denies vaginal discharge irritation or bleeding.  She does report she has a history of ovarian cysts and this is both similar but not similar to same time she says.  Patient states she has a primary care and OB/GYN.     Past Medical History:  Diagnosis Date  . Migraines   . TMJ (dislocation of temporomandibular joint)     Patient Active Problem List   Diagnosis Date Noted  . Cervical high risk HPV (human papillomavirus) test positive 06/07/2017  . Breakthrough bleeding on OCPs 03/20/2014  . Routine gynecological examination 03/07/2013    Past Surgical History:  Procedure Laterality Date  . 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 wisdom teeth removal      OB History    Gravida  0   Para  0   Term  0   Preterm  0   AB  0   Living  0     SAB  0   TAB  0   Ectopic  0   Multiple  0   Live Births               Home Medications    Prior to Admission medications   Medication Sig Start Date End Date  Taking? Authorizing Provider  acetaminophen (TYLENOL) 325 MG tablet Take 2 tablets (650 mg total) by mouth every 6 (six) hours as needed. 09/09/20   Stanislav Gervase, Veryl Speak, PA-C  fluticasone (FLONASE) 50 MCG/ACT nasal spray 1 spray by Each Nare route daily.    [provider]  meloxicam (MOBIC) 15 MG tablet Take 1 tablet (15 mg total) by mouth daily. 11/06/18   Georgetta Haber, NP    Family History Family History  Problem Relation Age of Onset  . Arthritis Mother   . Hypertension Mother   . Liver disease Father   . Cancer Maternal Aunt        breast CA  . Cancer Maternal Grandmother        breast CA  . Hypertension Maternal Grandmother   . Diabetes Paternal Grandmother   . Cancer Paternal Grandfather        liver    Social History Social History   Tobacco Use  . Smoking status: Never Smoker  . Smokeless tobacco: Never Used  Substance Use Topics  . Alcohol use: Yes  Comment: Socially  . Drug use: No     Allergies   Patient has no known allergies.   Review of Systems Review of Systems   Physical Exam Triage Vital Signs ED Triage Vitals  Enc Vitals Group     BP 09/09/20 1829 121/73     Pulse Rate 09/09/20 1829 92     Resp 09/09/20 1829 19     Temp 09/09/20 1829 98.5 F (36.9 C)     Temp Source 09/09/20 1829 Oral     SpO2 09/09/20 1829 100 %     Weight --      Height --      Head Circumference --      Peak Flow --      Pain Score 09/09/20 1827 4     Pain Loc --      Pain Edu? --      Excl. in GC? --    No data found.  Updated Vital Signs BP 121/73 (BP Location: Right Arm)   Pulse 92   Temp 98.5 F (36.9 C) (Oral)   Resp 19   SpO2 100%   Visual Acuity Right Eye Distance:   Left Eye Distance:   Bilateral Distance:    Right Eye Near:   Left Eye Near:    Bilateral Near:     Physical Exam Vitals and nursing note reviewed.  Constitutional:      General: She is not in acute distress.    Appearance: She is well-developed. She is not  ill-appearing.  HENT:     Head: Normocephalic and atraumatic.  Eyes:     Conjunctiva/sclera: Conjunctivae normal.  Cardiovascular:     Rate and Rhythm: Normal rate and regular rhythm.     Heart sounds: No murmur heard.   Pulmonary:     Effort: Pulmonary effort is normal. No respiratory distress.     Breath sounds: Normal breath sounds.  Abdominal:     General: Abdomen is flat. There is no distension.     Palpations: Abdomen is soft.     Tenderness: There is abdominal tenderness in the periumbilical area and suprapubic area. There is no right CVA tenderness, left CVA tenderness, guarding or rebound. Negative signs include Murphy's sign, Rovsing's sign, McBurney's sign and obturator sign.     Comments: Abdomen is soft.  Musculoskeletal:     Cervical back: Neck supple.  Skin:    General: Skin is warm and dry.  Neurological:     Mental Status: She is alert.      UC Treatments / Results  Labs (all labs ordered are listed, but only abnormal results are displayed) Labs Reviewed  POCT URINALYSIS DIPSTICK, ED / UC - Abnormal; Notable for the following components:      Result Value   Ketones, ur 15 (*)    Hgb urine dipstick TRACE (*)    All other components within normal limits  POC URINE PREG, ED  CERVICOVAGINAL ANCILLARY ONLY    EKG   Radiology No results found.  Procedures Procedures (including critical care time)  Medications Ordered in UC Medications - No data to display  Initial Impression / Assessment and Plan / UC Course  I have reviewed the triage vital signs and the nursing notes.  Pertinent labs & imaging results that were available during my care of the patient were reviewed by me and considered in my medical decision making (see chart for details).     #Lower abdominal pain 34 year old presenting with lower abdominal  pain.  UA negative for infection.  She is afebrile with normal vital signs.  No evidence of acute abdomen.  Differential be broad.  Low  suspicion for appendicitis.  Given her history of cyst, would likely benefit from pelvic ultrasound, however given she is not In severe pain and location not primarily in the pelvis, low suspicion for torsion at this time.  Do believe she could follow-up outpatient with OB/GYN.  Discharged home with strict emergency department cautions for further developing abdominal pain.  Instruct her to follow-up with primary care and OB/GYN.  Patient verbalized agreement understanding plan of care Final Clinical Impressions(s) / UC Diagnoses   Final diagnoses:  Lower abdominal pain     Discharge Instructions     Monitor your symptoms, if they are acutely worsening go to the emergency department for further evaluation  Follow-up with your primary care and your OB/GYN  Continue Tylenol and ibuprofen  We have sent your test and will call you if we need to add any treatments.  Otherwise this will be in your MyChart      ED Prescriptions    Medication Sig Dispense Auth. Provider   acetaminophen (TYLENOL) 325 MG tablet Take 2 tablets (650 mg total) by mouth every 6 (six) hours as needed. 30 tablet Johnnell Liou, Veryl Speak, PA-C     PDMP not reviewed this encounter.   Hermelinda Medicus, PA-C 09/09/20 2127

## 2020-09-09 NOTE — Discharge Instructions (Signed)
Monitor your symptoms, if they are acutely worsening go to the emergency department for further evaluation  Follow-up with your primary care and your OB/GYN  Continue Tylenol and ibuprofen  We have sent your test and will call you if we need to add any treatments.  Otherwise this will be in your MyChart

## 2020-09-09 NOTE — ED Triage Notes (Signed)
Pt is here with abdominal pain that started 3 days ago, pt has taken Advil to relieve discomfort.

## 2020-09-10 LAB — CERVICOVAGINAL ANCILLARY ONLY
Bacterial Vaginitis (gardnerella): NEGATIVE
Candida Glabrata: NEGATIVE
Candida Vaginitis: NEGATIVE
Chlamydia: NEGATIVE
Comment: NEGATIVE
Comment: NEGATIVE
Comment: NEGATIVE
Comment: NEGATIVE
Comment: NEGATIVE
Comment: NORMAL
Neisseria Gonorrhea: NEGATIVE
Trichomonas: NEGATIVE

## 2020-09-11 ENCOUNTER — Emergency Department (HOSPITAL_COMMUNITY): Payer: BC Managed Care – PPO

## 2020-09-11 ENCOUNTER — Encounter (HOSPITAL_COMMUNITY): Payer: Self-pay | Admitting: Emergency Medicine

## 2020-09-11 ENCOUNTER — Emergency Department (HOSPITAL_COMMUNITY)
Admission: EM | Admit: 2020-09-11 | Discharge: 2020-09-12 | Disposition: A | Discharge status: Home / Self Care | Payer: BC Managed Care – PPO | Attending: Emergency Medicine | Admitting: Emergency Medicine

## 2020-09-11 ENCOUNTER — Other Ambulatory Visit: Payer: Self-pay

## 2020-09-11 DIAGNOSIS — R102 Pelvic and perineal pain unspecified side: Secondary | ICD-10-CM

## 2020-09-11 DIAGNOSIS — R1030 Lower abdominal pain, unspecified: Secondary | ICD-10-CM | POA: Diagnosis not present

## 2020-09-11 DIAGNOSIS — R109 Unspecified abdominal pain: Secondary | ICD-10-CM | POA: Diagnosis present

## 2020-09-11 HISTORY — DX: Allergy, unspecified, initial encounter: T78.40XA

## 2020-09-11 LAB — URINALYSIS, ROUTINE W REFLEX MICROSCOPIC
Bilirubin Urine: NEGATIVE
Glucose, UA: NEGATIVE mg/dL
Ketones, ur: NEGATIVE mg/dL
Leukocytes,Ua: NEGATIVE
Nitrite: NEGATIVE
Protein, ur: NEGATIVE mg/dL
Specific Gravity, Urine: 1.01 (ref 1.005–1.030)
pH: 6 (ref 5.0–8.0)

## 2020-09-11 LAB — CBC
HCT: 39.2 % (ref 36.0–46.0)
Hemoglobin: 12.5 g/dL (ref 12.0–15.0)
MCH: 27.6 pg (ref 26.0–34.0)
MCHC: 31.9 g/dL (ref 30.0–36.0)
MCV: 86.5 fL (ref 80.0–100.0)
Platelets: 380 10*3/uL (ref 150–400)
RBC: 4.53 MIL/uL (ref 3.87–5.11)
RDW: 13.8 % (ref 11.5–15.5)
WBC: 9.6 10*3/uL (ref 4.0–10.5)
nRBC: 0 % (ref 0.0–0.2)

## 2020-09-11 LAB — LIPASE, BLOOD: Lipase: 30 U/L (ref 11–51)

## 2020-09-11 LAB — COMPREHENSIVE METABOLIC PANEL
ALT: 19 U/L (ref 0–44)
AST: 17 U/L (ref 15–41)
Albumin: 3.8 g/dL (ref 3.5–5.0)
Alkaline Phosphatase: 69 U/L (ref 38–126)
Anion gap: 10 (ref 5–15)
BUN: 7 mg/dL (ref 6–20)
CO2: 22 mmol/L (ref 22–32)
Calcium: 9.3 mg/dL (ref 8.9–10.3)
Chloride: 104 mmol/L (ref 98–111)
Creatinine, Ser: 0.72 mg/dL (ref 0.44–1.00)
GFR calc Af Amer: >60 mL/min (ref >60)
GFR calc non Af Amer: >60 mL/min (ref >60)
Glucose, Bld: 98 mg/dL (ref 70–99)
Potassium: 4.2 mmol/L (ref 3.5–5.1)
Sodium: 136 mmol/L (ref 135–145)
Total Bilirubin: 0.6 mg/dL (ref 0.3–1.2)
Total Protein: 7.6 g/dL (ref 6.5–8.1)

## 2020-09-11 LAB — I-STAT BETA HCG BLOOD, ED (MC, WL, AP ONLY): I-stat hCG, quantitative: <5 m[IU]/mL (ref <5)

## 2020-09-11 LAB — URINALYSIS, MICROSCOPIC (REFLEX)

## 2020-09-11 NOTE — ED Provider Notes (Signed)
[facility] EMERGENCY DEPARTMENT Provider Note   CSN: [id number] Arrival date & time: 09/11/20  1128     History Chief Complaint  Patient presents with  . Abdominal Pain    Lynn Sosa is a 34 y.o. female history of HPV, ovarian cyst, migraines, allergies.  Patient presents today for abdominal pain onset 4 days ago.  Pain moderate intensity sharp lower abdomen nonradiating no clear aggravating or alleviating factors.  Pain comes in waves, when it is most severe it is a sharp sensation, in between these peaks of pain there is a constant mild dull pain that does not radiate.  She reports she went to urgent care 2 days ago, she had urine test and vaginal swab performed which did not show evidence of infection.  She has not been taking any medications and returned home when pain worsened she came to this emergency department for evaluation.  Associated symptom nausea and one episode of nonbloody diarrhea yesterday.  Denies fever/chills, headache, chest pain/shortness of breath, cough, vomiting, dysuria/hematuria, vaginal bleeding/discharge, fall/injury, concern for STI or any additional concerns. HPI     Past Medical History:  Diagnosis Date  . Allergies   . Migraines   . TMJ (dislocation of temporomandibular joint)     Patient Active Problem List   Diagnosis Date Noted  . Cervical high risk HPV (human papillomavirus) test positive 06/07/2017  . Breakthrough bleeding on OCPs 03/20/2014  . Routine gynecological examination 03/07/2013    Past Surgical History:  Procedure Laterality Date  . 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 wisdom teeth removal       OB History    Gravida  0   Para  0   Term  0   Preterm  0   AB  0   Living  0     SAB  0   TAB  0   Ectopic  0   Multiple  0   Live Births              Family History  Problem Relation Age of Onset  . Arthritis Mother   . Hypertension Mother   . Liver  disease Father   . Cancer Maternal Aunt        breast CA  . Cancer Maternal Grandmother        breast CA  . Hypertension Maternal Grandmother   . Diabetes Paternal Grandmother   . Cancer Paternal Grandfather        liver    Social History   Tobacco Use  . Smoking status: Never Smoker  . Smokeless tobacco: Never Used  Substance Use Topics  . Alcohol use: Yes    Comment: Socially  . Drug use: No    Home Medications Prior to Admission medications   Medication Sig Start Date End Date Taking? Authorizing Provider  acetaminophen (TYLENOL) 325 MG tablet Take 2 tablets (650 mg total) by mouth every 6 (six) hours as needed. 09/09/20   Darr, Veryl Speak, PA-C  fluticasone (FLONASE) 50 MCG/ACT nasal spray 1 spray by Each Nare route daily.    [provider]  meloxicam (MOBIC) 15 MG tablet Take 1 tablet (15 mg total) by mouth daily. 11/06/18   Georgetta Haber, NP    Allergies    Patient has no known allergies.  Review of Systems   Review of Systems Ten systems are reviewed and are negative for acute change except as noted in  the HPI  Physical Exam Updated Vital Signs BP 112/78 (BP Location: Right Arm)   Pulse 69   Temp 98.7 F (37.1 C)   Resp 18   Ht 5\' 11"  (1.803 m)   Wt 113.4 kg   SpO2 100%   BMI 34.87 kg/m   Physical Exam Constitutional:      General: She is not in acute distress.    Appearance: Normal appearance. She is well-developed. She is not ill-appearing or diaphoretic.  HENT:     Head: Normocephalic and atraumatic.  Eyes:     General: Vision grossly intact. Gaze aligned appropriately.     Pupils: Pupils are equal, round, and reactive to light.  Neck:     Trachea: Trachea and phonation normal.  Pulmonary:     Effort: Pulmonary effort is normal. No respiratory distress.  Abdominal:     General: There is no distension.     Palpations: Abdomen is soft.     Tenderness: There is abdominal tenderness (Mild) in the suprapubic area. There is no guarding or  rebound. Negative signs include Murphy's sign and McBurney's sign.  Genitourinary:    Comments: Refused by patient Musculoskeletal:        General: Normal range of motion.     Cervical back: Normal range of motion.  Skin:    General: Skin is warm and dry.  Neurological:     Mental Status: She is alert.     GCS: GCS eye subscore is 4. GCS verbal subscore is 5. GCS motor subscore is 6.     Comments: Speech is clear and goal oriented, follows commands Major Cranial nerves without deficit, no facial droop Moves extremities without ataxia, coordination intact  Psychiatric:        Behavior: Behavior normal.    ED Results / Procedures / Treatments   Labs (all labs ordered are listed, but only abnormal results are displayed) Labs Reviewed  URINALYSIS, ROUTINE W REFLEX MICROSCOPIC - Abnormal; Notable for the following components:      Result Value   Hgb urine dipstick TRACE (*)    All other components within normal limits  URINALYSIS, MICROSCOPIC (REFLEX) - Abnormal; Notable for the following components:   Bacteria, UA FEW (*)    All other components within normal limits  LIPASE, BLOOD  COMPREHENSIVE METABOLIC PANEL  CBC  I-STAT BETA HCG BLOOD, ED ([facility], [facility], [facility] ONLY)    EKG None  Radiology PELVIC COMPLETE W TRANSVAGINAL AND TORSION R/O  Result Date: 09/11/2020 CLINICAL DATA:  Initial evaluation for intermittent pelvic pain, history of cysts. EXAM: TRANSABDOMINAL AND TRANSVAGINAL ULTRASOUND OF PELVIS DOPPLER ULTRASOUND OF OVARIES TECHNIQUE: Both transabdominal and transvaginal ultrasound examinations of the pelvis were performed. Transabdominal technique was performed for global imaging of the pelvis including uterus, ovaries, adnexal regions, and pelvic cul-de-sac. It was necessary to proceed with endovaginal exam following the transabdominal exam to visualize the uterus, endometrium, and ovaries. Color and duplex Doppler ultrasound was utilized to evaluate blood flow to the ovaries.  COMPARISON:  None. FINDINGS: Uterus Measurements: 8.3 x 3.7 x 4.4 cm = volume: 70.7 mL. No fibroids or other mass visualized. Endometrium Thickness: 8 mm. No focal abnormality visualized. IUD in appropriate position within the endometrial cavity. Right ovary Measurements: 4.0 x 2.4 x 2.9 cm = volume: 14.2 mL. Normal appearance/no adnexal mass. Left ovary Measurements: 3.9 x 2.1 x 2.5 cm = volume: 10.4 mL. 2.4 x 2.3 x 1.7 cm simple cyst noted, most compatible with a normal physiologic follicular  cyst/dominant follicle. No other adnexal mass. Pulsed Doppler evaluation of both ovaries demonstrates normal low-resistance arterial and venous waveforms. Other findings Small volume free fluid present within the pelvic cul-de-sac, presumably physiologic. IMPRESSION: 1. 2.4 cm simple left ovarian cyst, most compatible with a normal physiologic follicular cyst/dominant follicle. This has benign characteristics and is a common finding in premenopausal females. No imaging follow up is required. This follows consensus guidelines: Simple Adnexal Cysts: SRU Consensus Conference Update on Follow-up and Reporting. 2. Associated small volume free physiologic fluid within the pelvic cul-de-sac. 3. IUD in appropriate position within the endometrial cavity. 4. Otherwise unremarkable and normal pelvic ultrasound. No evidence for ovarian torsion or other acute abnormality. Electronically Signed   By: Rise MuBenjamin  McClintock M.D.   On: 09/11/2020 18:56    Procedures Procedures (including critical care time)  Medications Ordered in ED Medications - No data to display  ED Course  I have reviewed the triage vital signs and the nursing notes.  Pertinent labs & imaging results that were available during my care of the patient were reviewed by me and considered in my medical decision making (see chart for details).    MDM Rules/Calculators/A&P                         Additional history obtained from: 1. Nursing notes from this  visit. 2. Electronic medical record.  Reviewed patient's urgent care visit from 09/09/2020, she was diagnosed with lower abdominal pain.  She had dipstick urine which showed trace hemoglobin and 15 ketones.  Negative urine pregnancy test.  Negative cervicovaginal ancillary swab.  Further MDM it appears they discussed the potential for pelvic ultrasound to assess for ovarian pathology of her pain was not felt to be severe enough to warrant at that time.  She was discharged with return precautions and OB/GYN follow-up. -- 34 year old female presented today for worsening lower abdominal pain appears to wax and wane over the past 4 days.  Associated with some nausea and one episode of nonbloody diarrhea.  Lab work in triage is overall reassuring so far.  I reviewed and interpreted labs which include: CBC within normal limits, no leukocytosis to suggest infection and no anemia. Lipase within normal limits doubt pancreatitis. CMP within normal limits, no emergent electrolyte derangement, AKI, LFT elevations or gap. Beta hCG negative. Urinalysis shows few bacteria and trace hemoglobin.  Patient without urinary symptoms doubt UTI.  Discussed plan of care with patient, low suspicion for STI at this time given she had negative swab 2 days ago.  She refused pelvic exam today.  We discussed potential for pelvic ultrasound to assess for possible ovarian torsion given intermittent sharp lower abdominal pains, she is agreeable.  Ultrasound ordered.  Will continue to monitor.  Low suspicion for appendicitis at this time given this is fourth day of symptoms, no white count or McBurney's point tenderness. - Pelvic US with Torsion Rule:  IMPRESSION:  1. 2.4 cm simple left ovarian cyst, most compatible with a normal  physiologic follicular cyst/dominant follicle. This has benign  characteristics and is a common finding in premenopausal females. No  imaging follow up is required. This follows consensus guidelines:    Simple Adnexal Cysts: SRU Consensus Conference Update on Follow-up  and Reporting.  2. Associated small volume free physiologic fluid within the pelvic  cul-de-sac.  3. IUD in appropriate position within the endometrial cavity.  4. Otherwise unremarkable and normal pelvic ultrasound. No evidence  for ovarian torsion or  other acute abnormality.  - Patient reassessed sitting comfortably fully dressed in chair, NAD.  No significant tenderness of the abdomen, doubt cholecystitis, appendicitis, SBO, perforation, PID or other emergent intra-abdominal/pelvic etiologies.  At this time there does not appear to be any evidence of an acute emergency medical condition and the patient appears stable for discharge with appropriate outpatient follow up. Diagnosis was discussed with patient who verbalizes understanding of care plan and is agreeable to discharge. I have discussed return precautions with patient who verbalizes understanding. Patient encouraged to follow-up with their PCP. All questions answered.  Patient seen and evaluated by Dr. Denton Lank, agrees with discharge and PCP follow-up.   Note: Portions of this report may have been transcribed using voice recognition software. Every effort was made to ensure accuracy; however, inadvertent computerized transcription errors may still be present. Final Clinical Impression(s) / ED Diagnoses Final diagnoses:  Lower abdominal pain    Rx / DC Orders ED Discharge Orders    None       Elizabeth Palau 09/11/20 1916    Cathren Laine, MD 09/12/20 [phone]

## 2020-09-11 NOTE — ED Notes (Signed)
Pt in [place]

## 2020-09-11 NOTE — ED Triage Notes (Signed)
Pt reports lower abd pain and nausea since Sunday.  Seen at [facility] on Tuesday.  Reports diarrhea since yesterday.  Denies vomiting.

## 2020-09-11 NOTE — Discharge Instructions (Signed)
At this time there does not appear to be the presence of an emergent medical condition, however there is always the potential for conditions to change. Please read and follow the below instructions.  Please return to the Emergency Department immediately for any new or worsening symptoms. Please follow-up with your primary care provider this week for a recheck.  Call their office tomorrow to schedule a follow-up appointment.  If you feel worse at any time do not wait for your follow-up appointment and instead return immediately to the ER.    Go to the nearest Emergency Department immediately if: You have fever or chills You cannot stop vomiting. Your pain is only in areas of your belly, such as the right side or the left lower part of the belly. You have bloody or black poop, or poop that looks like tar. You have very bad pain, cramping, or bloating in your belly. You have signs of not having enough fluid or water in your body (dehydration), such as: Dark pee, very little pee, or no pee. Cracked lips. Dry mouth. Sunken eyes. Sleepiness. Weakness. You have trouble breathing or chest pain. You have any new/concerning or worsening of symptoms   Please read the additional information packets attached to your discharge summary.  Do not take your medicine if  develop an itchy rash, swelling in your mouth or lips, or difficulty breathing; call 911 and seek immediate emergency medical attention if this occurs.  You may review your lab tests and imaging results in their entirety on your MyChart account.  Please discuss all results of fully with your primary care provider and other specialist at your follow-up visit.  Note: Portions of this text may have been transcribed using voice recognition software. Every effort was made to ensure accuracy; however, inadvertent computerized transcription errors may still be present.

## 2020-12-01 ENCOUNTER — Other Ambulatory Visit: Payer: Self-pay

## 2020-12-01 ENCOUNTER — Ambulatory Visit (HOSPITAL_COMMUNITY)
Admission: EM | Admit: 2020-12-01 | Discharge: 2020-12-01 | Disposition: A | Discharge status: Home / Self Care | Payer: BC Managed Care – PPO

## 2020-12-02 ENCOUNTER — Ambulatory Visit (HOSPITAL_COMMUNITY)
Admission: RE | Admit: 2020-12-02 | Discharge: 2020-12-02 | Disposition: A | Discharge status: Home / Self Care | Payer: BC Managed Care – PPO | Source: Ambulatory Visit

## 2020-12-02 ENCOUNTER — Encounter (HOSPITAL_COMMUNITY): Payer: Self-pay

## 2020-12-02 ENCOUNTER — Other Ambulatory Visit: Payer: Self-pay

## 2020-12-02 VITALS — BP 126/82 | HR 75 | Temp 98.6°F | Resp 18

## 2020-12-02 DIAGNOSIS — J00 Acute nasopharyngitis [common cold]: Secondary | ICD-10-CM

## 2020-12-02 DIAGNOSIS — J4530 Mild persistent asthma, uncomplicated: Secondary | ICD-10-CM

## 2020-12-02 DIAGNOSIS — J3089 Other allergic rhinitis: Secondary | ICD-10-CM

## 2020-12-02 MED ORDER — METHYLPREDNISOLONE 4 MG PO TBPK: ORAL_TABLET | ORAL | 0 refills | Status: AC

## 2020-12-02 MED ORDER — AMOXICILLIN 875 MG PO TABS: 875.0000 mg | ORAL_TABLET | Freq: Two times a day (BID) | ORAL | 0 refills | Status: DC

## 2020-12-02 NOTE — Discharge Instructions (Addendum)
Takes Zyrtec and/or Claritin without pseudoephedrine.  We will be using the Medrol Dosepak.  If you are not seeing any improvement in the next 3 to 4 days then go ahead and fill the prescription for amoxicillin.  This is a prescription for an antibiotic and is appropriate for use if you continue to worsen to address a sinus infection.  Otherwise, drink plenty of fluids, get some rest and using Tylenol for aches and pains.

## 2020-12-02 NOTE — ED Triage Notes (Signed)
Patient started feeling bad on Wednesday.  Woke with sore throat, cough, headache.  Rapid testing was negative.  Over weekend felt worse again. Patient did another rapid test over weekend, this test negative as well.

## 2020-12-02 NOTE — ED Provider Notes (Signed)
[facility]   MRN: [id number] DOB: September 29, 1986  Subjective:   Lynn Sosa is a 34 y.o. female presenting for 6-day history of persistent runny and stuffy nose, postnasal drainage, coughing, sinus headache. She had had throat pain initially and was wondering if she could have strep throat. She had 2 rapid Covid tests in this past week and both were negative. She is Covid vaccinated. Denies any body aches, loss of sense of taste and smell, chest pain, shortness of breath. She does have a history of allergic rhinitis, asthma. Has been using Flonase and Allegra-D every day.  No current facility-administered medications for this encounter.  Current Outpatient Medications:  .  buPROPion (WELLBUTRIN XL) 150 MG 24 hr tablet, Take by mouth., Disp: , Rfl:  .  fexofenadine-pseudoephedrine (ALLEGRA-D) 60-120 MG 12 hr tablet, Take 1 tablet by mouth 2 (two) times daily., Disp: , Rfl:  .  fluticasone (FLONASE) 50 MCG/ACT nasal spray, 1 spray by Each Nare route daily., Disp: , Rfl:  .  pseudoephedrine-acetaminophen (TYLENOL SINUS) 30-500 MG TABS tablet, Take 1 tablet by mouth every 4 (four) hours as needed., Disp: , Rfl:  .  acetaminophen (TYLENOL) 325 MG tablet, Take 2 tablets (650 mg total) by mouth every 6 (six) hours as needed., Disp: 30 tablet, Rfl: 0 .  ibuprofen (ADVIL) 200 MG tablet, Take 200 mg by mouth every 6 (six) hours as needed., Disp: , Rfl:  .  meloxicam (MOBIC) 15 MG tablet, Take 1 tablet (15 mg total) by mouth daily., Disp: 20 tablet, Rfl: 0   No Known Allergies  Past Medical History:  Diagnosis Date  . Allergies   . Migraines   . TMJ (dislocation of temporomandibular joint)      Past Surgical History:  Procedure Laterality Date  . 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 wisdom teeth removal      Family History  Problem Relation Age of Onset  . Arthritis Mother   . Hypertension Mother   . Liver disease Father   . Cancer Maternal Aunt         breast CA  . Cancer Maternal Grandmother        breast CA  . Hypertension Maternal Grandmother   . Diabetes Paternal Grandmother   . Cancer Paternal Grandfather        liver    Social History   Tobacco Use  . Smoking status: Never Smoker  . Smokeless tobacco: Never Used  Substance Use Topics  . Alcohol use: Yes    Comment: Socially  . Drug use: No    ROS   Objective:   Vitals: BP 126/82 (BP Location: Right Arm) Comment (BP Location): large cuff  Pulse 75   Temp 98.6 F (37 C) (Oral)   Resp 18   SpO2 97%   Physical Exam Constitutional:      General: She is not in acute distress.    Appearance: Normal appearance. She is well-developed. She is not ill-appearing, toxic-appearing or diaphoretic.  HENT:     Head: Normocephalic and atraumatic.     Right Ear: Tympanic membrane, ear canal and external ear normal. No drainage or tenderness. No middle ear effusion. Tympanic membrane is not erythematous.     Left Ear: Tympanic membrane, ear canal and external ear normal. No drainage or tenderness.  No middle ear effusion. Tympanic membrane is not erythematous.     Nose: Congestion present. No rhinorrhea.  Comments: No sinus tenderness.    Mouth/Throat:     Mouth: Mucous membranes are moist. No oral lesions.     Pharynx: No pharyngeal swelling, oropharyngeal exudate, posterior oropharyngeal erythema or uvula swelling.     Tonsils: No tonsillar exudate or tonsillar abscesses.     Comments: Thick streaks of postnasal drainage overlying pharynx. Eyes:     General: No scleral icterus.       Right eye: No discharge.        Left eye: No discharge.     Extraocular Movements: Extraocular movements intact.     Right eye: Normal extraocular motion.     Left eye: Normal extraocular motion.     Conjunctiva/sclera: Conjunctivae normal.     Pupils: Pupils are equal, round, and reactive to light.  Cardiovascular:     Rate and Rhythm: Normal rate and regular rhythm.      Pulses: Normal pulses.     Heart sounds: Normal heart sounds. No murmur heard. No friction rub. No gallop.   Pulmonary:     Effort: Pulmonary effort is normal. No respiratory distress.     Breath sounds: Normal breath sounds. No stridor. No wheezing, rhonchi or rales.  Musculoskeletal:     Cervical back: Normal range of motion and neck supple.  Lymphadenopathy:     Cervical: No cervical adenopathy.  Skin:    General: Skin is warm and dry.     Findings: No rash.  Neurological:     General: No focal deficit present.     Mental Status: She is alert and oriented to person, place, and time.  Psychiatric:        Mood and Affect: Mood normal.        Behavior: Behavior normal.        Thought Content: Thought content normal.        Judgment: Judgment normal.      Assessment and Plan :   PDMP not reviewed this encounter.  1. Acute rhinitis   2. Allergic rhinitis due to other allergic trigger, unspecified seasonality   3. Mild persistent asthma without complication     COVID 19 testing pending. Recommended Medrol pack as patient does not like prednisone. Advised that she avoid using pseudoephedrine for now. Offered her prescription for amoxicillin if her symptoms persist in the next 3 to 4 days to address bacterial rhinosinusitis. Otherwise patient can discard prescription. Counseled patient on potential for adverse effects with medications prescribed/recommended today, ER and return-to-clinic precautions discussed, patient verbalized understanding.    Wallis Bamberg, PA-C 12/02/20 1452

## 2020-12-11 ENCOUNTER — Ambulatory Visit (HOSPITAL_COMMUNITY)
Admission: RE | Admit: 2020-12-11 | Discharge: 2020-12-11 | Disposition: A | Discharge status: Home / Self Care | Payer: BC Managed Care – PPO | Source: Ambulatory Visit | Attending: Family Medicine | Admitting: Family Medicine

## 2020-12-11 ENCOUNTER — Other Ambulatory Visit: Payer: Self-pay

## 2020-12-11 ENCOUNTER — Encounter (HOSPITAL_COMMUNITY): Payer: Self-pay

## 2020-12-11 VITALS — BP 118/80 | HR 76 | Temp 98.6°F | Resp 17

## 2020-12-11 DIAGNOSIS — Z791 Long term (current) use of non-steroidal anti-inflammatories (NSAID): Secondary | ICD-10-CM | POA: Insufficient documentation

## 2020-12-11 DIAGNOSIS — J069 Acute upper respiratory infection, unspecified: Secondary | ICD-10-CM | POA: Diagnosis not present

## 2020-12-11 DIAGNOSIS — Z79899 Other long term (current) drug therapy: Secondary | ICD-10-CM | POA: Diagnosis not present

## 2020-12-11 DIAGNOSIS — Z7952 Long term (current) use of systemic steroids: Secondary | ICD-10-CM | POA: Insufficient documentation

## 2020-12-11 DIAGNOSIS — J039 Acute tonsillitis, unspecified: Secondary | ICD-10-CM | POA: Insufficient documentation

## 2020-12-11 DIAGNOSIS — R051 Acute cough: Secondary | ICD-10-CM | POA: Insufficient documentation

## 2020-12-11 DIAGNOSIS — Z20822 Contact with and (suspected) exposure to covid-19: Secondary | ICD-10-CM | POA: Insufficient documentation

## 2020-12-11 LAB — RESP PANEL BY RT-PCR (FLU A&B, COVID) ARPGX2
Influenza A by PCR: NEGATIVE
Influenza B by PCR: NEGATIVE
SARS Coronavirus 2 by RT PCR: NEGATIVE

## 2020-12-11 LAB — POCT RAPID STREP A, ED / UC: Streptococcus, Group A Screen (Direct): NEGATIVE

## 2020-12-11 MED ORDER — DOXYCYCLINE HYCLATE 100 MG PO TABS: 100.0000 mg | ORAL_TABLET | Freq: Two times a day (BID) | ORAL | 0 refills | Status: AC

## 2020-12-11 NOTE — ED Triage Notes (Signed)
Pt presents with ongoing headache, congestion, sore throat, and non productive cough for over 2 weeks that has been unrelieved with prescribed and OTC medication; pt has had 2 negative covid test during this time period.

## 2020-12-11 NOTE — ED Provider Notes (Signed)
[facility]    CSN: [id number] Arrival date & time: 12/11/20  1251      History   Chief Complaint Chief Complaint  Patient presents with  . Appointment  . URI    HPI Lynn Sosa is a 34 y.o. female.   Patient presenting today with 2 weeks of ongoing congestion, sinus pain and pressure, sore swollen throat, cough, headache. States now she is also having pus pockets on tonsils and more swelling than usual on them. So far has been taking numerous OTC medications, steroids, and amoxil without much relief. Denies fever, chills, CP, SOB, abdominal pain. States she almost always gets strep when she gets sick and wants to be tested, and wants re-testing for flu and COVID as she hasn't been tested since onset.      Past Medical History:  Diagnosis Date  . Allergies   . Migraines   . TMJ (dislocation of temporomandibular joint)     Patient Active Problem List   Diagnosis Date Noted  . Cervical high risk HPV (human papillomavirus) test positive 06/07/2017  . Breakthrough bleeding on OCPs 03/20/2014  . Routine gynecological examination 03/07/2013    Past Surgical History:  Procedure Laterality Date  . 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 wisdom teeth removal      OB History    Gravida  0   Para  0   Term  0   Preterm  0   AB  0   Living  0     SAB  0   IAB  0   Ectopic  0   Multiple  0   Live Births               Home Medications    Prior to Admission medications   Medication Sig Start Date End Date Taking? Authorizing Provider  acetaminophen (TYLENOL) 325 MG tablet Take 2 tablets (650 mg total) by mouth every 6 (six) hours as needed. 09/09/20   Darr, Gerilyn Pilgrim, PA-C  buPROPion (WELLBUTRIN XL) 150 MG 24 hr tablet Take by mouth. 09/23/20 12/22/20  [provider]  doxycycline (VIBRA-TABS) 100 MG tablet Take 1 tablet (100 mg total) by mouth 2 (two) times daily. 12/11/20   Particia Nearing, PA-C   fexofenadine-pseudoephedrine (ALLEGRA-D) 60-120 MG 12 hr tablet Take 1 tablet by mouth 2 (two) times daily. 09/23/20 12/22/20  [provider]  fluticasone (FLONASE) 50 MCG/ACT nasal spray 1 spray by Each Nare route daily.    [provider]  ibuprofen (ADVIL) 200 MG tablet Take 200 mg by mouth every 6 (six) hours as needed.    [provider]  meloxicam (MOBIC) 15 MG tablet Take 1 tablet (15 mg total) by mouth daily. 11/06/18   Linus Mako B, NP  methylPREDNISolone (MEDROL DOSEPAK) 4 MG TBPK tablet Day 1: Take 2 tablets 3 times today with food.  Day 2: 4 mg (1 tablet) before breakfast, 4 mg (1 tablet) after lunch, 4 mg (1 tablet) after supper, and 8 mg (2 tablets) at bedtime. Day 3: 4 mg (1 tablet) before breakfast, 4 mg (1 tablet) after lunch, 4 mg (1 tablet) after supper, and 4 mg (1 tablet) at bedtime. Day 4: 4 mg (1 tablet) before breakfast, 4 mg (1 tablet) after lunch, and 4 mg (1 tablet) at bedtime. Day 5: 4 mg (1 tablet) before breakfast and 4 mg (1 tablet) at bedtime. Day 6: 4 mg (1 tablet) before breakfast.  12/02/20   Wallis Bamberg, PA-C  pseudoephedrine-acetaminophen (TYLENOL SINUS) 30-500 MG TABS tablet Take 1 tablet by mouth every 4 (four) hours as needed.    [provider]    Family History Family History  Problem Relation Age of Onset  . Arthritis Mother   . Hypertension Mother   . Liver disease Father   . Cancer Maternal Aunt        breast CA  . Cancer Maternal Grandmother        breast CA  . Hypertension Maternal Grandmother   . Diabetes Paternal Grandmother   . Cancer Paternal Grandfather        liver    Social History Social History   Tobacco Use  . Smoking status: Never Smoker  . Smokeless tobacco: Never Used  Substance Use Topics  . Alcohol use: Yes    Comment: Socially  . Drug use: No     Allergies   Patient has no known allergies.   Review of Systems Review of Systems PER HPI   Physical Exam Triage Vital  Signs ED Triage Vitals [12/11/20 1341]  Enc Vitals Group     BP 118/80     Pulse Rate 76     Resp 17     Temp 98.6 F (37 C)     Temp Source Oral     SpO2 98 %     Weight      Height      Head Circumference      Peak Flow      Pain Score 6     Pain Loc      Pain Edu?      Excl. in GC?    No data found.  Updated Vital Signs BP 118/80 (BP Location: Left Arm)   Pulse 76   Temp 98.6 F (37 C) (Oral)   Resp 17   SpO2 98%   Visual Acuity Right Eye Distance:   Left Eye Distance:   Bilateral Distance:    Right Eye Near:   Left Eye Near:    Bilateral Near:     Physical Exam Vitals and nursing note reviewed.  Constitutional:      Appearance: Normal appearance. She is not ill-appearing.  HENT:     Head: Atraumatic.     Right Ear: Tympanic membrane normal.     Left Ear: Tympanic membrane normal.     Nose: Congestion present.     Mouth/Throat:     Mouth: Mucous membranes are moist.     Pharynx: Oropharyngeal exudate and posterior oropharyngeal erythema (and b/l tonsillar edema) present.  Eyes:     Extraocular Movements: Extraocular movements intact.     Conjunctiva/sclera: Conjunctivae normal.  Cardiovascular:     Rate and Rhythm: Normal rate and regular rhythm.     Heart sounds: Normal heart sounds.  Pulmonary:     Effort: Pulmonary effort is normal. No respiratory distress.     Breath sounds: Normal breath sounds. No wheezing or rales.  Abdominal:     General: Bowel sounds are normal. There is no distension.     Palpations: Abdomen is soft.     Tenderness: There is no abdominal tenderness. There is no guarding.  Musculoskeletal:        General: Normal range of motion.     Cervical back: Normal range of motion and neck supple.  Skin:    General: Skin is warm and dry.  Neurological:     Mental Status: She is alert  and oriented to person, place, and time.  Psychiatric:        Mood and Affect: Mood normal.        Thought Content: Thought content normal.         Judgment: Judgment normal.      UC Treatments / Results  Labs (all labs ordered are listed, but only abnormal results are displayed) Labs Reviewed  RESP PANEL BY RT-PCR (FLU A&B, COVID) ARPGX2  CULTURE, GROUP A STREP [facility])  POCT RAPID STREP A, ED / UC  POCT RAPID STREP A, ED / UC    EKG   Radiology No results found.  Procedures Procedures (including critical care time)  Medications Ordered in UC Medications - No data to display  Initial Impression / Assessment and Plan / UC Course  I have reviewed the triage vital signs and the nursing notes.  Pertinent labs & imaging results that were available during my care of the patient were reviewed by me and considered in my medical decision making (see chart for details).     Rapid strep neg, vitals reassuring today. Exam showing persistent URI sxs and tonsillitis. Will send throat culture and resp panel, start doxycycline given persistence and poor response to initial therapies. Discussed continued allergy regimen, mucinex. Return if not resolving.   Final Clinical Impressions(s) / UC Diagnoses   Final diagnoses:  Upper respiratory tract infection, unspecified type   Discharge Instructions   None    ED Prescriptions    Medication Sig Dispense Auth. Provider   doxycycline (VIBRA-TABS) 100 MG tablet Take 1 tablet (100 mg total) by mouth 2 (two) times daily. 14 tablet Particia Nearing, [place]     PDMP not reviewed this encounter.   Particia Nearing, [place] 12/11/20 1635

## 2020-12-14 LAB — CULTURE, GROUP A STREP (THRC)

## 2021-01-12 IMAGING — US US PELVIS COMPLETE TRANSABD/TRANSVAG W DUPLEX
1 series · 13 of 25 positions shown · non-contrast
Comparison: None.

CLINICAL DATA: Initial evaluation for intermittent pelvic pain,
history of cysts.

EXAM:
TRANSABDOMINAL AND TRANSVAGINAL ULTRASOUND OF PELVIS
DOPPLER ULTRASOUND OF OVARIES
TECHNIQUE: Both transabdominal and transvaginal ultrasound examinations of the
pelvis were performed. Transabdominal technique was performed for
global imaging of the pelvis including uterus, ovaries, adnexal
regions, and pelvic cul-de-sac.
It was necessary to proceed with endovaginal exam following the
transabdominal exam to visualize the uterus, endometrium, and
ovaries. Color and duplex Doppler ultrasound was utilized to
evaluate blood flow to the ovaries.

[Series 1: us pelvis (transabdominal only) · 132 acquisitions, 13 frames shown]
[im 1/132]
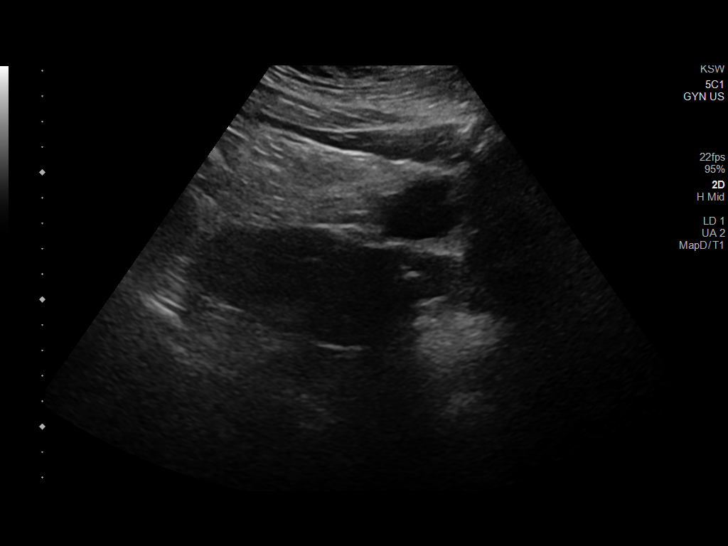
[im 11/132]
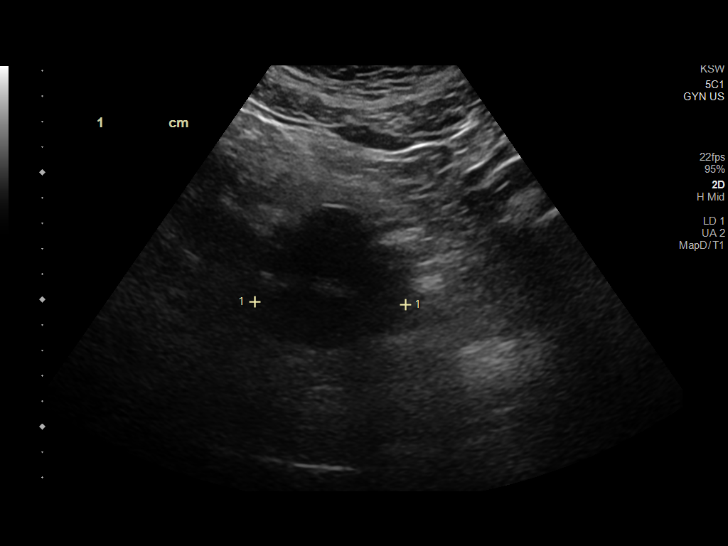
[im 22/132]
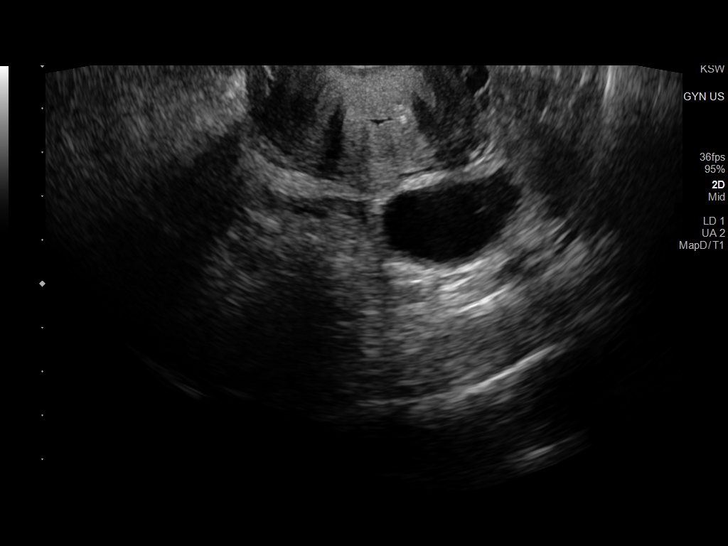
[im 33/132]
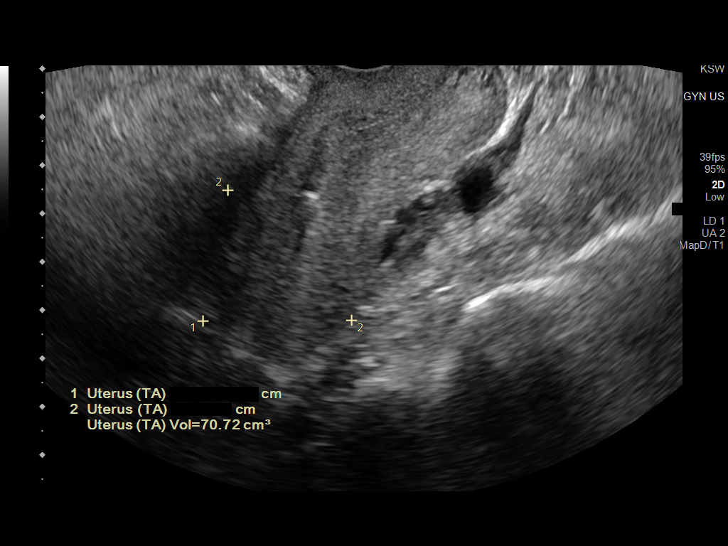
[im 44/132]
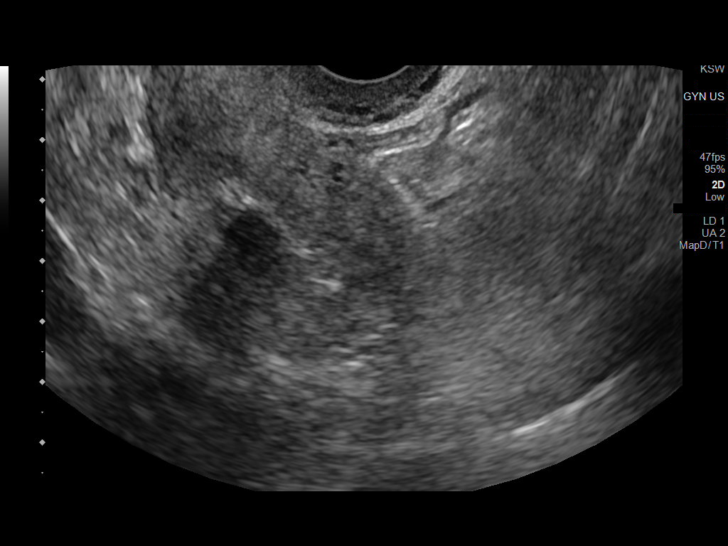
[im 55/132]
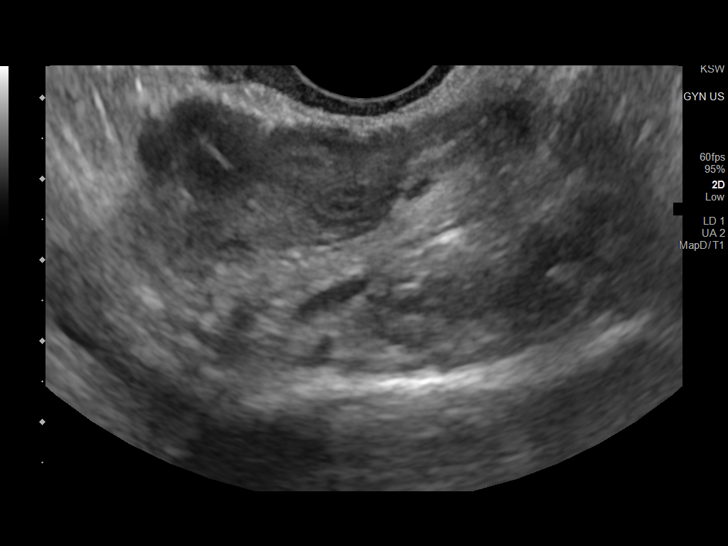
[im 66/132]
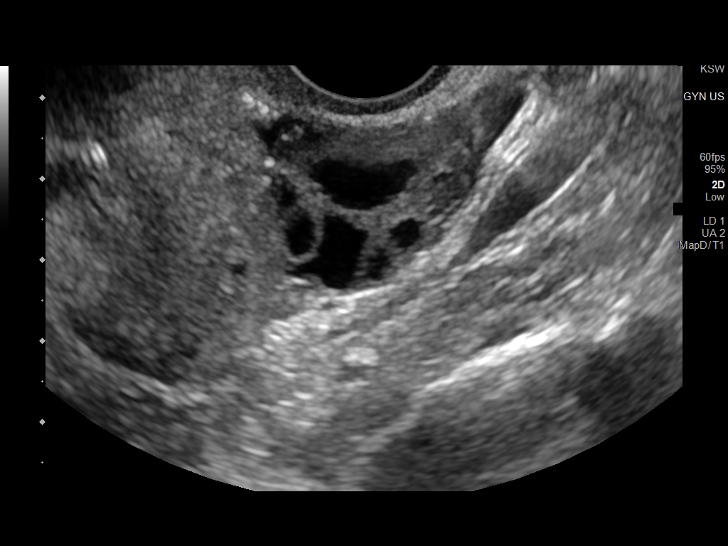
[im 77/132]
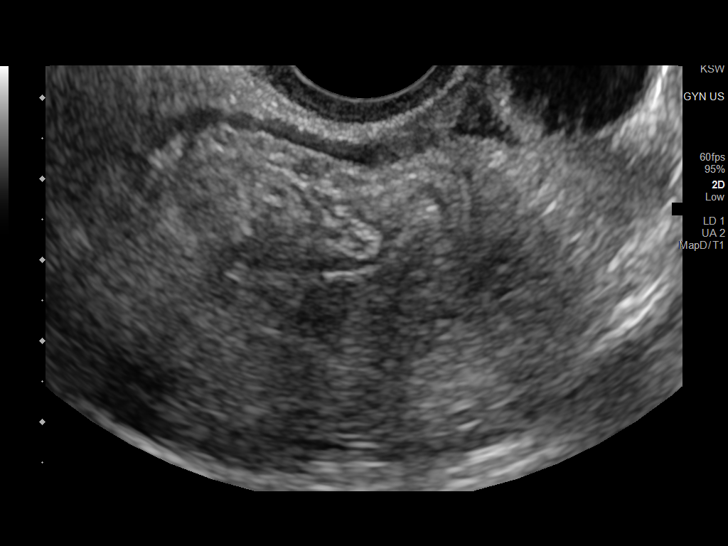
[im 88/132]
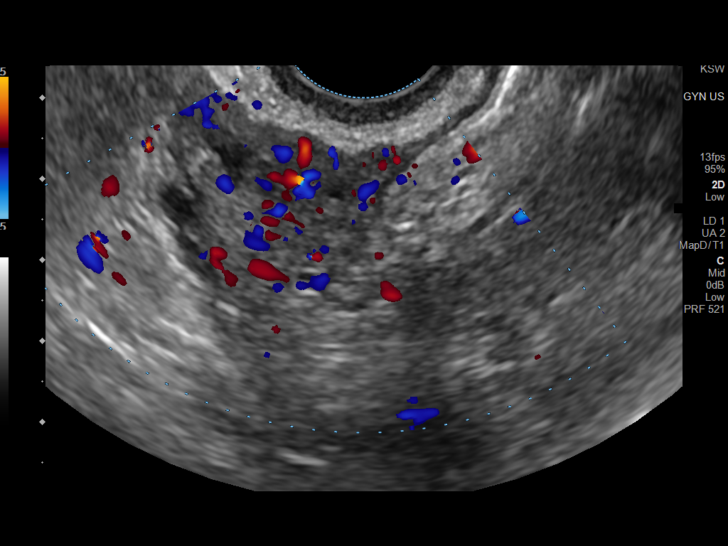
[im 99/132]
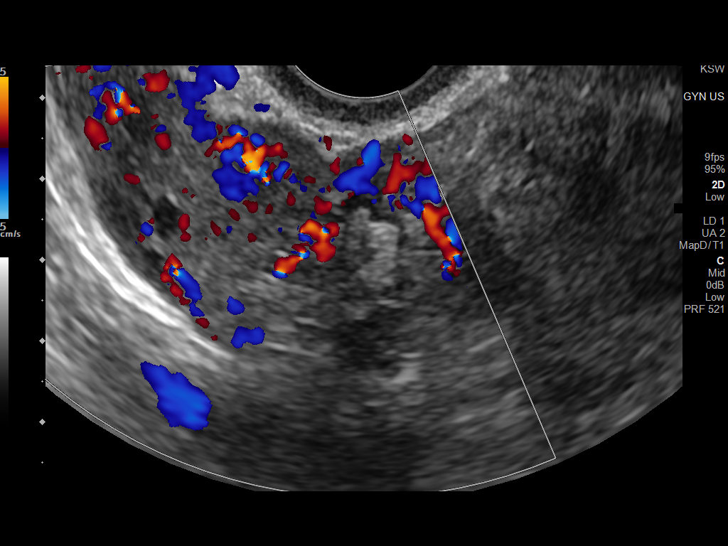
[im 110/132]
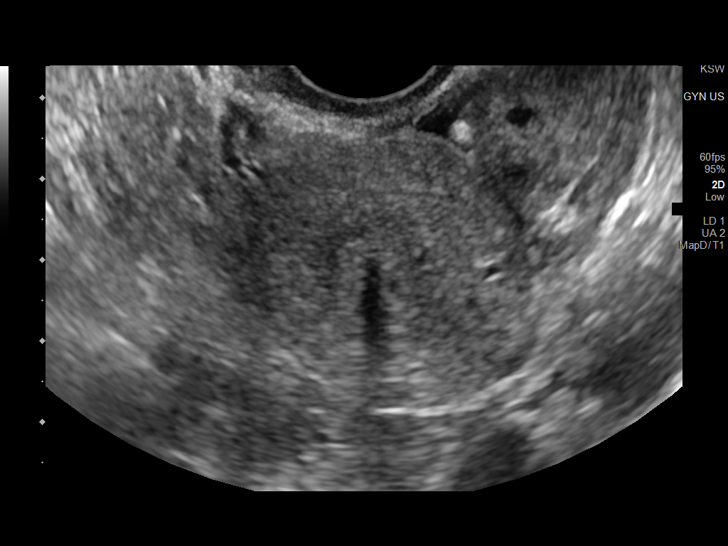
[im 121/132]
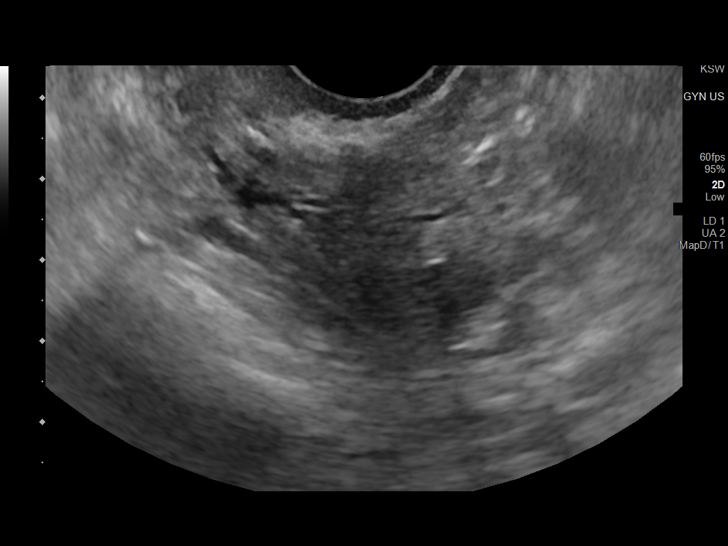
[im 132/132]
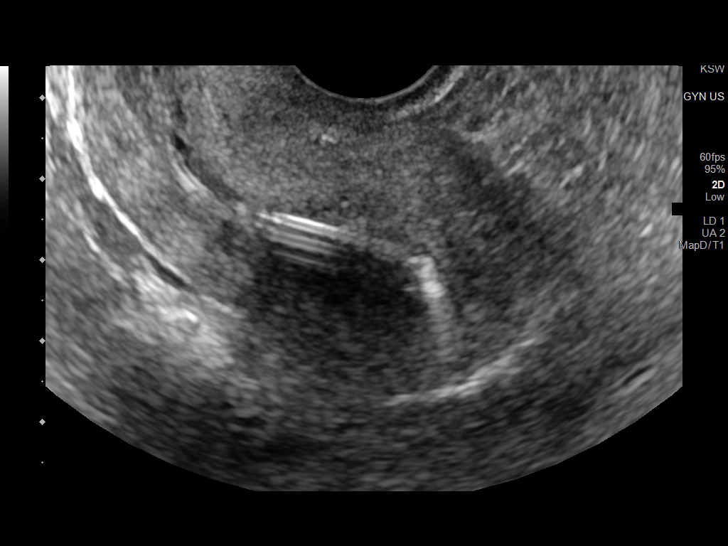

[13 of 25 positions shown; findings below may reference images not displayed]

FINDINGS: Uterus

Measurements: 8.3 x 3.7 x 4.4 cm = volume: 70.7 mL. No fibroids or
other mass visualized.

Endometrium

Thickness: 8 mm. No focal abnormality visualized. IUD in appropriate
position within the endometrial cavity.

Right ovary

Measurements: 4.0 x 2.4 x 2.9 cm = volume: 14.2 mL. Normal
appearance/no adnexal mass.

Left ovary

Measurements: 3.9 x 2.1 x 2.5 cm = volume: 10.4 mL. 2.4 x 2.3 x
cm simple cyst noted, most compatible with a normal physiologic
follicular cyst/dominant follicle. No other adnexal mass.

Pulsed Doppler evaluation of both ovaries demonstrates normal
low-resistance arterial and venous waveforms.

Other findings

Small volume free fluid present within the pelvic cul-de-sac,
presumably physiologic.
IMPRESSION: 1. 2.4 cm simple left ovarian cyst, most compatible with a normal
physiologic follicular cyst/dominant follicle. This has benign
characteristics and is a common finding in premenopausal females. No
imaging follow up is required. This follows consensus guidelines:
Simple Adnexal Cysts: SRU Consensus Conference Update on Follow-up
and Reporting.
2. Associated small volume free physiologic fluid within the pelvic
cul-de-sac.
3. IUD in appropriate position within the endometrial cavity.
4. Otherwise unremarkable and normal pelvic ultrasound. No evidence
for ovarian torsion or other acute abnormality.
